# Patient Record
Sex: Female | Born: 1990 | Race: White | Hispanic: No | Smoking: Never smoker
Health system: Southern US, Community
[De-identification: ages and names within clinical notes are randomized; demographics above are authoritative.]

## PROBLEM LIST (undated history)

## (undated) ENCOUNTER — Inpatient Hospital Stay: Payer: Self-pay

## (undated) DIAGNOSIS — F329 Major depressive disorder, single episode, unspecified: Secondary | ICD-10-CM

## (undated) DIAGNOSIS — F32A Depression, unspecified: Secondary | ICD-10-CM

---

## 2015-09-06 ENCOUNTER — Encounter: Payer: Self-pay | Admitting: Emergency Medicine

## 2015-09-06 ENCOUNTER — Emergency Department
Admission: EM | Admit: 2015-09-06 | Discharge: 2015-09-07 | Disposition: A | Discharge status: Home / Self Care | Payer: 59 | Attending: Emergency Medicine | Admitting: Emergency Medicine

## 2015-09-06 DIAGNOSIS — F32A Depression, unspecified: Secondary | ICD-10-CM

## 2015-09-06 DIAGNOSIS — F332 Major depressive disorder, recurrent severe without psychotic features: Secondary | ICD-10-CM

## 2015-09-06 DIAGNOSIS — Z3202 Encounter for pregnancy test, result negative: Secondary | ICD-10-CM | POA: Insufficient documentation

## 2015-09-06 DIAGNOSIS — R45851 Suicidal ideations: Secondary | ICD-10-CM

## 2015-09-06 DIAGNOSIS — F329 Major depressive disorder, single episode, unspecified: Secondary | ICD-10-CM

## 2015-09-06 LAB — ETHANOL: Alcohol, Ethyl (B): <5 mg/dL (ref <5)

## 2015-09-06 LAB — URINALYSIS COMPLETE WITH MICROSCOPIC (ARMC ONLY)
BACTERIA UA: NONE SEEN
Bilirubin Urine: NEGATIVE
Glucose, UA: NEGATIVE mg/dL
Ketones, ur: NEGATIVE mg/dL
LEUKOCYTES UA: NEGATIVE
Nitrite: NEGATIVE
PH: 5 (ref 5.0–8.0)
Protein, ur: NEGATIVE mg/dL
Specific Gravity, Urine: 1.01 (ref 1.005–1.030)

## 2015-09-06 LAB — URINE DRUG SCREEN, QUALITATIVE (ARMC ONLY)
AMPHETAMINES, UR SCREEN: NOT DETECTED
Barbiturates, Ur Screen: NOT DETECTED
Benzodiazepine, Ur Scrn: NOT DETECTED
Cannabinoid 50 Ng, Ur ~~LOC~~: NOT DETECTED
Cocaine Metabolite,Ur ~~LOC~~: NOT DETECTED
MDMA (ECSTASY) UR SCREEN: NOT DETECTED
Methadone Scn, Ur: NOT DETECTED
OPIATE, UR SCREEN: NOT DETECTED
PHENCYCLIDINE (PCP) UR S: NOT DETECTED
Tricyclic, Ur Screen: NOT DETECTED

## 2015-09-06 LAB — COMPREHENSIVE METABOLIC PANEL
ALT: 13 U/L — ABNORMAL LOW (ref 14–54)
AST: 19 U/L (ref 15–41)
Albumin: 4 g/dL (ref 3.5–5.0)
Alkaline Phosphatase: 27 U/L — ABNORMAL LOW (ref 38–126)
Anion gap: 5 (ref 5–15)
BILIRUBIN TOTAL: 0.4 mg/dL (ref 0.3–1.2)
BUN: 14 mg/dL (ref 6–20)
CHLORIDE: 109 mmol/L (ref 101–111)
CO2: 27 mmol/L (ref 22–32)
Calcium: 9.1 mg/dL (ref 8.9–10.3)
Creatinine, Ser: 0.97 mg/dL (ref 0.44–1.00)
Glucose, Bld: 79 mg/dL (ref 65–99)
POTASSIUM: 3.8 mmol/L (ref 3.5–5.1)
Sodium: 141 mmol/L (ref 135–145)
TOTAL PROTEIN: 7.3 g/dL (ref 6.5–8.1)

## 2015-09-06 LAB — CBC
HEMATOCRIT: 43.4 % (ref 35.0–47.0)
Hemoglobin: 14.6 g/dL (ref 12.0–16.0)
MCH: 30.9 pg (ref 26.0–34.0)
MCHC: 33.6 g/dL (ref 32.0–36.0)
MCV: 92.1 fL (ref 80.0–100.0)
Platelets: 225 10*3/uL (ref 150–440)
RBC: 4.71 MIL/uL (ref 3.80–5.20)
RDW: 12.8 % (ref 11.5–14.5)
WBC: 8.2 10*3/uL (ref 3.6–11.0)

## 2015-09-06 LAB — PREGNANCY, URINE: Preg Test, Ur: NEGATIVE

## 2015-09-06 LAB — POCT PREGNANCY, URINE: PREG TEST UR: NEGATIVE

## 2015-09-06 LAB — SALICYLATE LEVEL: Salicylate Lvl: <4 mg/dL (ref 2.8–30.0)

## 2015-09-06 LAB — ACETAMINOPHEN LEVEL: Acetaminophen (Tylenol), Serum: <10 ug/mL — ABNORMAL LOW (ref 10–30)

## 2015-09-06 MED ORDER — DIAZEPAM 5 MG PO TABS
5.0000 mg | ORAL_TABLET | Freq: Once | ORAL | Status: DC
  Filled 2015-09-06: qty 1

## 2015-09-06 MED ORDER — SERTRALINE HCL 25 MG PO TABS
25.0000 mg | ORAL_TABLET | Freq: Every day | ORAL | Status: DC
  Administered 2015-09-07: 25 mg via ORAL
  Filled 2015-09-06: qty 1

## 2015-09-06 NOTE — ED Notes (Signed)
Pt states "i don't feel mentally well, i need to get help". Pt states has been feeling depressed for over one month. Pt denies SI.

## 2015-09-06 NOTE — ED Notes (Signed)
Pt given urine cup for sample.

## 2015-09-06 NOTE — ED Notes (Signed)
BEHAVIORAL HEALTH ROUNDING Patient sleeping: No. Patient alert and oriented: yes Behavior appropriate: Yes.  ; If no, describe:  Nutrition and fluids offered: Yes  Toileting and hygiene offered: Yes  Sitter present: yes Law enforcement present: Yes  

## 2015-09-06 NOTE — ED Provider Notes (Signed)
[facility] Emergency Department Provider Note     Time seen: ----------------------------------------- 10:28 PM on 09/06/2015 -----------------------------------------    I have reviewed the triage vital signs and the nursing notes.   HISTORY  Chief Complaint Psychiatric Evaluation    HPI Savannah Callahan is a 24 y.o. female who presents to the ER stating she doesn't feel mentally well and she needs to get help. She states she's been feeling depressed for over a month, denies any suicidal thoughts but is tearful and feels like she just can't go any further. She states for the last 6-8 months she's been feeling depressed but she is Worse, was previously treated by psychologist but has never been on any medication.   History reviewed. No pertinent past medical history.  There are no active problems to display for this patient.   History reviewed. No pertinent past surgical history.  Allergies Review of patient's allergies indicates no known allergies.  Social History Social History  Substance Use Topics  . Smoking status: Never Smoker   . Smokeless tobacco: Never Used  . Alcohol Use: No    Review of Systems Constitutional: Negative for fever. Eyes: Negative for visual changes. ENT: Negative for sore throat. Cardiovascular: Negative for chest pain. Respiratory: Negative for shortness of breath. Gastrointestinal: Negative for abdominal pain, vomiting and diarrhea. Genitourinary: Negative for dysuria. Musculoskeletal: Negative for back pain. Skin: Negative for rash. Neurological: Negative for headaches, focal weakness or numbness. Psychiatric: Positive for depression, negative for suicidal ideation 10-point ROS otherwise negative.  ____________________________________________   PHYSICAL EXAM:  VITAL SIGNS: ED Triage Vitals  Enc Vitals Group     BP 09/06/15 2159 130/96 mmHg     Pulse Rate 09/06/15 2159 74     Resp 09/06/15 2159 16      Temp 09/06/15 2159 98.5 F (36.9 C)     Temp Source 09/06/15 2159 Oral     SpO2 09/06/15 2159 100 %     Weight 09/06/15 2159 165 lb (74.844 kg)     Height 09/06/15 2159 5\' 6"  (1.676 m)     Head Cir --      Peak Flow --      Pain Score --      Pain Loc --      Pain Edu? --      Excl. in GC? --     Constitutional: Alert and oriented. No distress Eyes: Conjunctivae are normal. PERRL. Normal extraocular movements. Cardiovascular: Normal rate, regular rhythm. Normal and symmetric distal pulses are present in all extremities. No murmurs, rubs, or gallops. Respiratory: Normal respiratory effort without tachypnea nor retractions. Breath sounds are clear and equal bilaterally. No wheezes/rales/rhonchi. Gastrointestinal: Soft and nontender. No distention. No abdominal bruits.  Musculoskeletal: Nontender with normal range of motion in all extremities. No joint effusions.  No lower extremity tenderness nor edema. Neurologic:  Normal speech and language. No gross focal neurologic deficits are appreciated. Speech is normal. No gait instability. Skin:  Skin is warm, dry and intact. No rash noted. Psychiatric: Patient with depressed mood and affect, tears easily.  ____________________________________________  ED COURSE:  Pertinent labs & imaging results that were available during my care of the patient were reviewed by me and considered in my medical decision making (see chart for details). Patient is markedly depressed likely with major depressive episode. Would benefit from talking to psychiatry. I will start her on Zoloft here. ____________________________________________    LABS (pertinent positives/negatives)  Labs Reviewed  CBC  COMPREHENSIVE METABOLIC PANEL  ETHANOL  SALICYLATE LEVEL  ACETAMINOPHEN LEVEL  URINE DRUG SCREEN, QUALITATIVE ([facility] ONLY)  PREGNANCY, URINE  URINALYSIS COMPLETEWITH MICROSCOPIC ([facility] ONLY)  POC URINE PREG, ED    ____________________________________________  FINAL ASSESSMENT AND PLAN  Depression  Plan: Patient with labs as dictated above. Patient's been started on Zoloft, is medically stable to talk to psychiatry.   Emily Filbert, MD   Emily Filbert, MD 09/06/15 [phone]

## 2015-09-06 NOTE — ED Notes (Signed)

## 2015-09-06 NOTE — ED Notes (Signed)
BEHAVIORAL HEALTH ROUNDING Patient sleeping: Yes.   Patient alert and oriented: yes Behavior appropriate: Yes.  ; If no, describe:  Nutrition and fluids offered: sleeping Toileting and hygiene offered: sleeping Sitter present: no Law enforcement present: Yes  

## 2015-09-07 DIAGNOSIS — R45851 Suicidal ideations: Secondary | ICD-10-CM

## 2015-09-07 DIAGNOSIS — F332 Major depressive disorder, recurrent severe without psychotic features: Secondary | ICD-10-CM

## 2015-09-07 DIAGNOSIS — F329 Major depressive disorder, single episode, unspecified: Secondary | ICD-10-CM

## 2015-09-07 MED ORDER — SERTRALINE HCL 50 MG PO TABS: 50.0000 mg | ORAL_TABLET | Freq: Every day | ORAL | Status: DC

## 2015-09-07 MED ORDER — DIPHENHYDRAMINE HCL 25 MG PO CAPS
50.0000 mg | ORAL_CAPSULE | Freq: Once | ORAL | Status: AC
  Administered 2015-09-07: 50 mg via ORAL

## 2015-09-07 MED ORDER — DIPHENHYDRAMINE HCL 25 MG PO CAPS
ORAL_CAPSULE | ORAL | Status: AC
  Administered 2015-09-07: 50 mg via ORAL
  Filled 2015-09-07: qty 2

## 2015-09-07 NOTE — ED Notes (Signed)

## 2015-09-07 NOTE — BH Assessment (Signed)
Assessment Note  Savannah Callahan is an 24 y.o. female. Savannah Callahan reports to the ED under voluntary status.  She reports being depressed.  She states, "I'm having a rough time in my relationship, my marriage, and my life".  She states that she and her husband got into "a pretty big fight".  She stated "I don't feel mentally healthy".   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  She reports that she feels sad, has feelings of loss, a loss of control, feelings of hopelessness, easily upset, and can't feel happy.  She further stated that her husband told her he wanted a divorce and that he wanted nothing to do with her. She reports that she is having trouble handling her emotions.  She denied symptoms of anxiety.  She denied auditory or visual hallucinations.  She denied suicidal or homicidal ideation or intent.  Diagnosis:   Past Medical History: History reviewed. No pertinent past medical history.  History reviewed. No pertinent past surgical history.  Family History: History reviewed. No pertinent family history.  Social History:  reports that she has never smoked. She has never used smokeless tobacco. She reports that she does not drink alcohol or use illicit drugs.  Additional Social History:  Alcohol / Drug Use History of alcohol / drug use?: No history of alcohol / drug abuse  CIWA: CIWA-Ar BP: (!) 130/96 mmHg Pulse Rate: 74 COWS:    Allergies: No Known Allergies  Home Medications:  (Not in a hospital admission)  OB/GYN Status:  Patient's last menstrual period was 09/06/2015.  General Assessment Data [facility] Assessment: In system Is this a Tele or Face-to-Face Assessment?: Face-to-Face Is this an Initial Assessment or a Re-assessment for this encounter?: Initial Assessment Marital status: Married [place]: -did not want to disclose Is patient pregnant?: No Pregnancy Status: No Living Arrangements:  Spouse/significant other (3 step children) Can pt return to current living arrangement?: Yes Admission Status: Voluntary Is patient capable of signing voluntary admission?: Yes Referral Source: Self/Family/Friend Insurance type: Research officer, trade [employer] healthcare  Medical Screening Exam [facility] Walk-in ONLY) Medical Exam completed: Yes  Crisis Care Plan Living Arrangements: Spouse/significant other (3 step children) Name of Psychiatrist: None reported Name of Therapist: Clinical research associateCatheryn Wagner - [facility]  Education Status Is patient currently in school?: No Current Grade: n/a Highest grade of school patient has completed: Best boyDoctorate Name of [employer] Contact person: n/a  Risk to self with the past 6 months Suicidal Ideation: No Has patient been a risk to self within the past 6 months prior to admission? : No Suicidal Intent: No Has patient had any suicidal intent within the past 6 months prior to admission? : No Is patient at risk for suicide?: No Suicidal Plan?: No Has patient had any suicidal plan within the past 6 months prior to admission? : No Access to Means: No What has been your use of drugs/alcohol within the last 12 months?: Denied Previous Attempts/Gestures: No How many times?: 0 Other Self Harm Risks: None Triggers for Past Attempts: None known Intentional Self Injurious Behavior: None Family Suicide History: No Recent stressful life event(s): Conflict (Comment) (Fight with husband) Persecutory voices/beliefs?: No Depression: Yes Depression Symptoms: Feeling angry/irritable, Tearfulness, Feeling worthless/self pity Substance abuse history and/or treatment for substance abuse?: No Suicide prevention information given to non-admitted patients: Not applicable  Risk to Others within the past 6 months Homicidal Ideation: No Does patient have  any lifetime risk of violence toward others beyond the six months prior to admission? : No Thoughts of Harm to  Others: No Current Homicidal Intent: No Current Homicidal Plan: No Access to Homicidal Means: No Identified Victim: None reported History of harm to others?: No Assessment of Violence: None Noted Violent Behavior Description: None reported Does patient have access to weapons?: No Criminal Charges Pending?: No Does patient have a court date: No Is patient on probation?: No  Psychosis Hallucinations: None noted Delusions: None noted  Mental Status Report Appearance/Hygiene: In scrubs, Unremarkable Eye Contact: Poor Motor Activity: Unremarkable Speech: Soft Level of Consciousness: Alert Mood: Sad Affect: Flat Anxiety Level: None Thought Processes: Coherent Judgement: Unimpaired Orientation: Person, Place, Time, Situation Obsessive Compulsive Thoughts/Behaviors: None  Cognitive Functioning Concentration: Fair Memory: Recent Intact IQ: Average Insight: Fair Appetite: Good Sleep: No Change  ADLScreening [facility] Assessment Services) Patient's cognitive ability adequate to safely complete daily activities?: Yes Patient able to express need for assistance with ADLs?: Yes Independently performs ADLs?: Yes (appropriate for developmental age)  Prior Inpatient Therapy Prior Inpatient Therapy: No  Prior Outpatient Therapy Prior Outpatient Therapy: Yes Prior Therapy Dates: 2016 Prior Therapy Facilty/Provider(s): [facility] Reason for Treatment: Depression Does patient have an ACCT team?: No Does patient have Intensive In-House Services?  : No Does patient have [facility] services? : No Does patient have P4CC services?: No  ADL Screening (condition at time of admission) Patient's cognitive ability adequate to safely complete daily activities?: Yes Patient able to express need for assistance with ADLs?: Yes Independently performs ADLs?: Yes (appropriate for developmental age)       Abuse/Neglect Assessment (Assessment to be complete while patient is  alone) Physical Abuse: Denies Verbal Abuse: Denies Sexual Abuse: Denies Exploitation of patient/patient's resources: Denies Self-Neglect: Denies Values / Beliefs Cultural Requests During Hospitalization: None Spiritual Requests During Hospitalization: None   Advance Directives (For Healthcare) Does patient have an advance directive?: No Would patient like information on creating an advanced directive?: No - patient declined information    Additional Information 1:1 In Past 12 Months?: No CIRT Risk: No Elopement Risk: No Does patient have medical clearance?: Yes     Disposition:  Disposition Initial Assessment Completed for this Encounter: Yes Disposition of Patient: Referred to (To be seen by the psychiatrist)  On Site Evaluation by:   Reviewed with Physician:    Justice Deeds 09/07/2015 12:40 AM

## 2015-09-07 NOTE — ED Notes (Signed)
Patient took medication without incident. Patient resting quietly in room. No noted distress or abnormal behaviors noted. Will continue 15 minute checks and observation by security camera for safety.

## 2015-09-07 NOTE — ED Notes (Signed)
Patient transferred to ED BHU Rm 6 without difficulty.  Patient oriented to unit and informed of cameras in all rooms for patient safety.  Patient verbalized understanding.

## 2015-09-07 NOTE — ED Notes (Signed)

## 2015-09-07 NOTE — ED Notes (Signed)
ED BHU PLACEMENT JUSTIFICATION Is the patient under IVC or is there intent for IVC: No. Is the patient medically cleared: Yes.   Is there vacancy in the ED BHU: Yes.   Is the population mix appropriate for patient: Yes.   Is the patient awaiting placement in inpatient or outpatient setting: No. Has the patient had a psychiatric consult: No. Survey of unit performed for contraband, proper placement and condition of furniture, tampering with fixtures in bathroom, shower, and each patient room: Yes.  ; Findings:  APPEARANCE/BEHAVIOR calm NEURO ASSESSMENT Orientation: person Hallucinations: No.None noted (Hallucinations) Speech: Normal Gait: normal RESPIRATORY ASSESSMENT Normal expansion.  Clear to auscultation.  No rales, rhonchi, or wheezing. CARDIOVASCULAR ASSESSMENT regular rate and rhythm, S1, S2 normal, no murmur, click, rub or gallop GASTROINTESTINAL ASSESSMENT soft, nontender, BS WNL, no r/g EXTREMITIES normal strength, tone, and muscle mass PLAN OF CARE Provide calm/safe environment. Vital signs assessed twice daily. ED BHU Assessment once each 12-hour shift. Collaborate with intake RN daily or as condition indicates. Assure the ED provider has rounded once each shift. Provide and encourage hygiene. Provide redirection as needed. Assess for escalating behavior; address immediately and inform ED provider.  Assess family dynamic and appropriateness for visitation as needed: Yes.  ; If necessary, describe findings:  Educate the patient/family about BHU procedures/visitation: Yes.  ; If necessary, describe findings:  

## 2015-09-07 NOTE — ED Notes (Signed)
Patient assigned to appropriate care area. Patient oriented to unit/care area: Informed that, for their safety, care areas are designed for safety and monitored by security cameras at all times; and visiting hours explained to patient. Patient verbalizes understanding, and verbal contract for safety obtained. 
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
## 2015-09-07 NOTE — ED Notes (Signed)
Pt. Noted in room resting quietly;. No complaints or concerns voiced. No distress or abnormal behavior noted. Will continue to monitor with security cameras. Q 15 minute rounds continue. 

## 2015-09-07 NOTE — ED Notes (Signed)
Patient discharged ambulatory to home. She has her car in the parking lot. She denies SI or HI. Discharge instructions reviewed with patient, she verbalizes understanding. Patient received copy of DC plan, prescription, and all personal belongings.

## 2015-09-07 NOTE — ED Notes (Signed)
Patient to the nursing station, reports thinks she was bitten by something and her foot was itching.  Area of redness noted to right great toe and to 4th digit right foot.  MD notified, medication order received.

## 2015-09-07 NOTE — ED Notes (Signed)
BEHAVIORAL HEALTH ROUNDING Patient sleeping: Yes.   Patient alert and oriented: yes Behavior appropriate: Yes.  ; If no, describe:  Nutrition and fluids offered: No Toileting and hygiene offered: Yes  Sitter present: no Law enforcement present: Yes  

## 2015-09-07 NOTE — ED Notes (Signed)
Patient anticipating discharge.  Maintained on 15 minute checks and observation by security camera for safety.

## 2015-09-07 NOTE — ED Notes (Signed)
Patient spoke with husband on the telephone. Patient has been quiet, in no apparent distress. Waiting to see the psychiatrist. Maintained on 15 minute checks and observation by security camera for safety.

## 2015-09-07 NOTE — ED Notes (Signed)
Patient resting quietly in room. No noted distress or abnormal behaviors noted. Will continue 15 minute checks and observation by security camera for safety. 

## 2015-09-07 NOTE — ED Provider Notes (Signed)
-----------------------------------------   7:35 AM on 09/07/2015 -----------------------------------------   Blood pressure 130/96, pulse 74, temperature 98.5 F (36.9 C), temperature source Oral, resp. rate 16, height 5\' 6"  (1.676 m), weight 165 lb (74.844 kg), last menstrual period 09/06/2015, SpO2 100 %.  The patient had no acute events since last update.  Calm and cooperative at this time.  Disposition is pending per Psychiatry/Behavioral Medicine team recommendations.     Irean HongJade J Rajohn Henery, MD 09/07/15 [phone]

## 2015-09-07 NOTE — ED Notes (Signed)
BEHAVIORAL HEALTH ROUNDING Patient sleeping: No. Patient alert and oriented: yes Behavior appropriate: Yes.  ; If no, describe:  Nutrition and fluids offered: Yes  Toileting and hygiene offered: Yes  Sitter present: no Law enforcement present: Yes  

## 2015-09-07 NOTE — ED Notes (Signed)
BEHAVIORAL HEALTH ROUNDING Patient sleeping: Yes.   Patient alert and oriented: yes Behavior appropriate: Yes.  ; If no, describe:  Nutrition and fluids offered: sleeping Toileting and hygiene offered: No Sitter present: no Law enforcement present: Yes

## 2015-09-07 NOTE — Discharge Instructions (Signed)
Major Depressive Disorder Major depressive disorder is a mental illness. It also may be called clinical depression or unipolar depression. Major depressive disorder usually causes feelings of sadness, hopelessness, or helplessness. Some people with this disorder do not feel particularly sad but lose interest in doing things they used to enjoy (anhedonia). Major depressive disorder also can cause physical symptoms. It can interfere with work, school, relationships, and other normal everyday activities. The disorder varies in severity but is longer lasting and more serious than the sadness we all feel from time to time in our lives. Major depressive disorder often is triggered by stressful life events or major life changes. Examples of these triggers include divorce, loss of your job or home, a move, and the death of a family member or close friend. Sometimes this disorder occurs for no obvious reason at all. People who have family members with major depressive disorder or bipolar disorder are at higher risk for developing this disorder, with or without life stressors. Major depressive disorder can occur at any age. It may occur just once in your life (single episode major depressive disorder). It may occur multiple times (recurrent major depressive disorder). SYMPTOMS People with major depressive disorder have either anhedonia or depressed mood on nearly a daily basis for at least 2 weeks or longer. Symptoms of depressed mood include:  Feelings of sadness (blue or down in the dumps) or emptiness.  Feelings of hopelessness or helplessness.  Tearfulness or episodes of crying (may be observed by others).  Irritability (children and adolescents). In addition to depressed mood or anhedonia or both, people with this disorder have at least four of the following symptoms:  Difficulty sleeping or sleeping too much.   Significant change (increase or decrease) in appetite or weight.   Lack of energy or  motivation.  Feelings of guilt and worthlessness.   Difficulty concentrating, remembering, or making decisions.  Unusually slow movement (psychomotor retardation) or restlessness (as observed by others).   Recurrent wishes for death, recurrent thoughts of self-harm (suicide), or a suicide attempt. People with major depressive disorder commonly have persistent negative thoughts about themselves, other people, and the world. People with severe major depressive disorder may experiencedistorted beliefs or perceptions about the world (psychotic delusions). They also may see or hear things that are not real (psychotic hallucinations). DIAGNOSIS Major depressive disorder is diagnosed through an assessment by your health care provider. Your health care provider will ask aboutaspects of your daily life, such as mood,sleep, and appetite, to see if you have the diagnostic symptoms of major depressive disorder. Your health care provider may ask about your medical history and use of alcohol or drugs, including prescription medicines. Your health care provider also may do a physical exam and blood work. This is because certain medical conditions and the use of certain substances can cause major depressive disorder-like symptoms (secondary depression). Your health care provider also may refer you to a mental health specialist for further evaluation and treatment. TREATMENT It is important to recognize the symptoms of major depressive disorder and seek treatment. The following treatments can be prescribed for this disorder:   Medicine. Antidepressant medicines usually are prescribed. Antidepressant medicines are thought to correct chemical imbalances in the brain that are commonly associated with major depressive disorder. Other types of medicine may be added if the symptoms do not respond to antidepressant medicines alone or if psychotic delusions or hallucinations occur.  Talk therapy. Talk therapy can be  helpful in treating major depressive disorder by providing   support, education, and guidance. Certain types of talk therapy also can help with negative thinking (cognitive behavioral therapy) and with relationship issues that trigger this disorder (interpersonal therapy). A mental health specialist can help determine which treatment is best for you. Most people with major depressive disorder do well with a combination of medicine and talk therapy. Treatments involving electrical stimulation of the brain can be used in situations with extremely severe symptoms or when medicine and talk therapy do not work over time. These treatments include electroconvulsive therapy, transcranial magnetic stimulation, and vagal nerve stimulation.   This information is not intended to replace advice given to you by your health care provider. Make sure you discuss any questions you have with your health care provider.   Document Released: 03/04/2013 Document Revised: 11/28/2014 Document Reviewed: 03/04/2013 Elsevier Interactive Patient Education 2016 [employer]  

## 2015-10-02 ENCOUNTER — Ambulatory Visit: Payer: Self-pay | Admitting: Physician Assistant

## 2016-11-21 NOTE — L&D Delivery Note (Signed)
Cesarean Section Procedure Note Indications: cephalo-pelvic disproportion, failure to progress: arrest of descent and term intrauterine pregnancy  Pre-operative Diagnosis: Intrauterine pregnancy [redacted]w[redacted]d ;  cephalo-pelvic disproportion, failure to progress: arrest of descent and term intrauterine pregnancy Post-operative Diagnosis: same, delivered. Procedure: Low Transverse Cesarean Section Surgeon: Paul Steffany Schoenfelder, MD, FACOG Assistant(s): [address] Anesthesia: Epidural anesthesia Estimated Blood Loss:800 mL Complications: None; patient tolerated the procedure well. Disposition: PACU - hemodynamically stable. Condition: stable  Findings: A female infant in the cephalic presentation. Amniotic fluid - Meconium  Birth weight 9lbs 5oz.  Apgars of 8 and 9.  Intact placenta with a three-vessel cord. Grossly normal uterus, tubes and ovaries bilaterally. No intraabdominal adhesions were noted.  Procedure Details   The patient was taken to Operating Room, identified as the correct patient and the procedure verified as C-Section Delivery. A Time Out was held and the above information confirmed. After induction of anesthesia, the patient was draped and prepped in the usual sterile manner. A Pfannenstiel incision was made and carried down through the subcutaneous tissue to the fascia. Fascial incision was made and extended transversely with the Mayo scissors. The fascia was separated from the underlying rectus tissue superiorly and inferiorly. The peritoneum was identified and entered bluntly. Peritoneal incision was extended longitudinally. The utero-vesical peritoneal reflection was incised transversely and a bladder flap was created digitally.  A low transverse hysterotomy was made. The fetus was delivered atraumatically. The umbilical cord was clamped x2 and cut and the infant was handed to the awaiting pediatricians. The placenta was removed intact and appeared normal with a 3-vessel cord.  The uterus was  exteriorized and cleared of all clot and debris. The hysterotomy was closed with running sutures of 0 Vicryl suture. A second imbricating layer was placed with the same suture. Excellent hemostasis was observed. The uterus was returned to the abdomen. The pelvis was irrigated and again, excellent hemostasis was noted.  The On Q Pain pump System was then placed.  Trocars were placed through the abdominal wall into the subfascial space and these were used to thread the silver soaker cathaters into place.The rectus fascia was then reapproximated with running sutures of Maxon, with careful placement not to incorporate the cathaters. Subcutaneous tissues are then irrigated with saline and hemostasis assured.  Skin is then closed with 4-0 vicryl suture in a subcuticular fashion followed by skin adhesive. The cathaters are flushed each with 5 mL of Bupivicaine and stabilized into place with dressing. Instrument, sponge, and needle counts were correct prior to the abdominal closure and at the conclusion of the case.  The patient tolerated the procedure well and was transferred to the recovery room in stable condition.    

## 2016-12-21 ENCOUNTER — Inpatient Hospital Stay
Admission: EM | Admit: 2016-12-21 | Discharge: 2016-12-25 | DRG: 766 | Disposition: A | Discharge status: Home / Self Care | Payer: BLUE CROSS/BLUE SHIELD | Attending: Obstetrics & Gynecology | Admitting: Obstetrics & Gynecology

## 2016-12-21 ENCOUNTER — Observation Stay
Admission: EM | Admit: 2016-12-21 | Discharge: 2016-12-21 | Disposition: A | Discharge status: Home / Self Care | Payer: BLUE CROSS/BLUE SHIELD | Attending: Advanced Practice Midwife | Admitting: Advanced Practice Midwife

## 2016-12-21 DIAGNOSIS — Z6791 Unspecified blood type, Rh negative: Secondary | ICD-10-CM

## 2016-12-21 DIAGNOSIS — O480 Post-term pregnancy: Secondary | ICD-10-CM | POA: Diagnosis not present

## 2016-12-21 DIAGNOSIS — Z3A40 40 weeks gestation of pregnancy: Secondary | ICD-10-CM | POA: Insufficient documentation

## 2016-12-21 DIAGNOSIS — O339 Maternal care for disproportion, unspecified: Secondary | ICD-10-CM | POA: Diagnosis present

## 2016-12-21 DIAGNOSIS — O471 False labor at or after 37 completed weeks of gestation: Secondary | ICD-10-CM | POA: Diagnosis present

## 2016-12-21 DIAGNOSIS — F32A Depression, unspecified: Secondary | ICD-10-CM | POA: Diagnosis present

## 2016-12-21 DIAGNOSIS — O9081 Anemia of the puerperium: Secondary | ICD-10-CM | POA: Diagnosis not present

## 2016-12-21 DIAGNOSIS — F329 Major depressive disorder, single episode, unspecified: Secondary | ICD-10-CM | POA: Diagnosis present

## 2016-12-21 DIAGNOSIS — Z8249 Family history of ischemic heart disease and other diseases of the circulatory system: Secondary | ICD-10-CM

## 2016-12-21 DIAGNOSIS — Z823 Family history of stroke: Secondary | ICD-10-CM

## 2016-12-21 DIAGNOSIS — O324XX0 Maternal care for high head at term, not applicable or unspecified: Principal | ICD-10-CM | POA: Diagnosis present

## 2016-12-21 DIAGNOSIS — O26893 Other specified pregnancy related conditions, third trimester: Secondary | ICD-10-CM | POA: Diagnosis present

## 2016-12-21 HISTORY — DX: Major depressive disorder, single episode, unspecified: F32.9

## 2016-12-21 HISTORY — DX: Depression, unspecified: F32.A

## 2016-12-21 NOTE — OB Triage Note (Signed)
Patient came in c/o contractions that initially began around 1500 as mild cramping. As th day progress the abdominal pain worsen. After 2200 abdominal pain were consistently back to back. Patient stated not knowing if they were contractions since she has never had them before. Around 2100 is when is started noticing scant blood. Patient showed me her panty liner, there was a 1in in diameter size bleeding, nothing alarming. Reports positve fetal movement. Denies LOF. Patient on the monitor and oriented to room.

## 2016-12-21 NOTE — Progress Notes (Signed)
Savannah Callahan, CNM phoned with patient's triage information. Provider made aware of patient's complaint, cervical exam, and bright red scant amount of blood on glove after vaginal exam. Provider wants patient to be rechecked in 1.5 hrs. Provider agrees with intermit. Monitoring. Provider placed order for observation. Will call [name] with an update after next vaginal exam.

## 2016-12-21 NOTE — Discharge Summary (Signed)
Physician Final Progress Note  Patient ID: Savannah Callahan MRN: [id number] DOB/AGE: 18-Dec-1990 26 y.o.  Admit date: 12/21/2016 Admitting provider: Tresea Mall, CNM Discharge date: 12/21/2016   Admission Diagnoses: G1P0 at [redacted]w[redacted]d with c/o abdominal pain/contractions q 2-3 minutes since 10pm last night and spotting when wiping. Pt admits positive fetal movement and denies LOF. She admits that she has not had adequate hydration all day yesterday.  Discharge Diagnoses:  Active Problems:   Labor and delivery, indication for care IUP at [redacted]w[redacted]d with reactive NST, not in labor  History of Present Illness: The patient was admitted for observation and placed on monitors, cervical exam  No past medical history on file.  No past surgical history on file.  No current facility-administered medications on file prior to encounter.    Current Outpatient Prescriptions on File Prior to Encounter  Medication Sig Dispense Refill  . sertraline (ZOLOFT) 50 MG tablet Take 1 tablet (50 mg total) by mouth daily. 30 tablet 0    No Known Allergies  Social History   Social History  . Marital status: Single    Spouse name: N/A  . Number of children: N/A  . Years of education: N/A   Occupational History  . Not on file.   Social History Main Topics  . Smoking status: Never Smoker  . Smokeless tobacco: Never Used  . Alcohol use No  . Drug use: No  . Sexual activity: Yes    Birth control/ protection: Pill     Comment: sex last on 12/20/16   Other Topics Concern  . Not on file   Social History Narrative  . No narrative on file    Physical Exam: BP 128/79   Pulse 86   Temp 97.6 F (36.4 C) (Oral)   Gen: NAD CV: RRR Pulm: CTAB Pelvic: 1.5/60/-2 on admission and no change prior to discharge Fetal Well Being: 125 bpm, moderate variability, +accelerations, -decelerations Category I Toco: q 2-3 on admission palpate mild, after observation time and hydration activity is irritability Ext: no  evidence of DVT  Consults: None  Significant Findings/ Diagnostic Studies: none  Procedures: NST  Discharge Condition: good  Disposition: 01-Home or Self Care  Diet: Regular diet  Discharge Activity: Activity as tolerated  Discharge Instructions    Discharge activity:  No Restrictions    Complete by:  As directed    Discharge diet:  No restrictions    Complete by:  As directed    Fetal Kick Count:  Lie on our left side for one hour after a meal, and count the number of times your baby kicks.  If it is less than 5 times, get up, move around and drink some juice.  Repeat the test 30 minutes later.  If it is still less than 5 kicks in an hour, notify your doctor.    Complete by:  As directed    LABOR:  When conractions begin, you should start to time them from the beginning of one contraction to the beginning  of the next.  When contractions are 5 - 10 minutes apart or less and have been regular for at least an hour, you should call your health care provider.    Complete by:  As directed    No sexual activity restrictions    Complete by:  As directed    Notify physician for bleeding from the vagina    Complete by:  As directed    Notify physician for blurring of vision or spots before the  eyes    Complete by:  As directed    Notify physician for chills or fever    Complete by:  As directed    Notify physician for fainting spells, "black outs" or loss of consciousness    Complete by:  As directed    Notify physician for increase in vaginal discharge    Complete by:  As directed    Notify physician for leaking of fluid    Complete by:  As directed    Notify physician for pain or burning when urinating    Complete by:  As directed    Notify physician for pelvic pressure (sudden increase)    Complete by:  As directed    Notify physician for severe or continued nausea or vomiting    Complete by:  As directed    Notify physician for sudden gushing of fluid from the vagina (with or  without continued leaking)    Complete by:  As directed    Notify physician for sudden, constant, or occasional abdominal pain    Complete by:  As directed    Notify physician if baby moving less than usual    Complete by:  As directed      Allergies as of 12/21/2016   No Known Allergies     Medication List    TAKE these medications   prenatal multivitamin Tabs tablet Take 1 tablet by mouth daily at 12 noon.   sertraline 50 MG tablet Commonly known as:  ZOLOFT Take 1 tablet (50 mg total) by mouth daily.      Follow up: Induction of labor scheduled for 12/25/2016 when pt is 41 weeks  Total time spent taking care of this patient: 15 minutes  Signed: Tresea MallGLEDHILL,Davyn Morandi, CNM  12/21/2016, 3:42 AM

## 2016-12-21 NOTE — Progress Notes (Signed)
Patient discharge to home. Discharge instructions reviewed. At home instructions given, explained when to return and what to expect during this stage of labor. Patient verbalized understanding. Patient discharge in stable condition, ambulatory, with significant other.

## 2016-12-22 ENCOUNTER — Encounter
Admission: EM | Disposition: A | Discharge status: Home / Self Care | Payer: Self-pay | Source: Home / Self Care | Attending: Obstetrics & Gynecology

## 2016-12-22 ENCOUNTER — Inpatient Hospital Stay: Payer: BLUE CROSS/BLUE SHIELD | Admitting: Anesthesiology

## 2016-12-22 DIAGNOSIS — O9081 Anemia of the puerperium: Secondary | ICD-10-CM | POA: Diagnosis not present

## 2016-12-22 DIAGNOSIS — Z8249 Family history of ischemic heart disease and other diseases of the circulatory system: Secondary | ICD-10-CM | POA: Diagnosis not present

## 2016-12-22 DIAGNOSIS — F32A Depression, unspecified: Secondary | ICD-10-CM | POA: Diagnosis present

## 2016-12-22 DIAGNOSIS — Z823 Family history of stroke: Secondary | ICD-10-CM | POA: Diagnosis not present

## 2016-12-22 DIAGNOSIS — O26893 Other specified pregnancy related conditions, third trimester: Secondary | ICD-10-CM | POA: Diagnosis present

## 2016-12-22 DIAGNOSIS — Z6791 Unspecified blood type, Rh negative: Secondary | ICD-10-CM | POA: Diagnosis not present

## 2016-12-22 DIAGNOSIS — O324XX0 Maternal care for high head at term, not applicable or unspecified: Secondary | ICD-10-CM | POA: Diagnosis present

## 2016-12-22 DIAGNOSIS — Z3A40 40 weeks gestation of pregnancy: Secondary | ICD-10-CM | POA: Diagnosis not present

## 2016-12-22 DIAGNOSIS — O339 Maternal care for disproportion, unspecified: Secondary | ICD-10-CM | POA: Diagnosis present

## 2016-12-22 DIAGNOSIS — Z3493 Encounter for supervision of normal pregnancy, unspecified, third trimester: Secondary | ICD-10-CM | POA: Diagnosis present

## 2016-12-22 DIAGNOSIS — F329 Major depressive disorder, single episode, unspecified: Secondary | ICD-10-CM | POA: Diagnosis present

## 2016-12-22 LAB — CBC
HEMATOCRIT: 37.6 % (ref 35.0–47.0)
HEMOGLOBIN: 13.3 g/dL (ref 12.0–16.0)
MCH: 32.2 pg (ref 26.0–34.0)
MCHC: 35.4 g/dL (ref 32.0–36.0)
MCV: 91 fL (ref 80.0–100.0)
Platelets: 214 10*3/uL (ref 150–440)
RBC: 4.13 MIL/uL (ref 3.80–5.20)
RDW: 14.4 % (ref 11.5–14.5)
WBC: 14.7 10*3/uL — ABNORMAL HIGH (ref 3.6–11.0)

## 2016-12-22 LAB — ABO/RH: ABO/RH(D): A NEG

## 2016-12-22 SURGERY — Surgical Case
Anesthesia: Epidural | Site: Abdomen | Wound class: Clean Contaminated

## 2016-12-22 MED ORDER — FENTANYL 2.5 MCG/ML W/ROPIVACAINE 0.2% IN NS 100 ML EPIDURAL INFUSION (ARMC-ANES): 10.0000 mL/h | EPIDURAL | Status: DC

## 2016-12-22 MED ORDER — LIDOCAINE HCL (PF) 1 % IJ SOLN: 30.0000 mL | INTRAMUSCULAR | Status: DC | PRN

## 2016-12-22 MED ORDER — DIPHENHYDRAMINE HCL 50 MG/ML IJ SOLN
12.5000 mg | INTRAMUSCULAR | Status: DC | PRN
  Filled 2016-12-22: qty 1

## 2016-12-22 MED ORDER — OXYTOCIN 40 UNITS IN LACTATED RINGERS INFUSION - SIMPLE MED
2.5000 [IU]/h | INTRAVENOUS | Status: DC
  Filled 2016-12-22: qty 1000

## 2016-12-22 MED ORDER — CEFOXITIN SODIUM-DEXTROSE 2-2.2 GM-% IV SOLR (PREMIX)
INTRAVENOUS | Status: AC
  Filled 2016-12-22: qty 50

## 2016-12-22 MED ORDER — EPHEDRINE 5 MG/ML INJ: 10.0000 mg | INTRAVENOUS | Status: DC | PRN

## 2016-12-22 MED ORDER — NALOXONE HCL 2 MG/2ML IJ SOSY
1.0000 ug/kg/h | PREFILLED_SYRINGE | INTRAVENOUS | Status: DC | PRN
  Filled 2016-12-22: qty 2

## 2016-12-22 MED ORDER — NALOXONE HCL 0.4 MG/ML IJ SOLN: 0.4000 mg | INTRAMUSCULAR | Status: DC | PRN

## 2016-12-22 MED ORDER — LACTATED RINGERS IV SOLN
INTRAVENOUS | Status: DC
  Administered 2016-12-22: 125 mL via INTRAVENOUS

## 2016-12-22 MED ORDER — DIPHENHYDRAMINE HCL 25 MG PO CAPS: 25.0000 mg | ORAL_CAPSULE | ORAL | Status: DC | PRN

## 2016-12-22 MED ORDER — FENTANYL CITRATE (PF) 100 MCG/2ML IJ SOLN
INTRAMUSCULAR | Status: AC
  Filled 2016-12-22: qty 2

## 2016-12-22 MED ORDER — MORPHINE SULFATE (PF) 0.5 MG/ML IJ SOLN
INTRAMUSCULAR | Status: AC
  Filled 2016-12-22: qty 10

## 2016-12-22 MED ORDER — LIDOCAINE HCL (PF) 1 % IJ SOLN
INTRAMUSCULAR | Status: DC | PRN
  Administered 2016-12-22: 3 mL

## 2016-12-22 MED ORDER — BUPIVACAINE HCL (PF) 0.25 % IJ SOLN
10.0000 mL | Freq: Once | INTRAMUSCULAR | Status: DC
  Filled 2016-12-22: qty 10

## 2016-12-22 MED ORDER — SOD CITRATE-CITRIC ACID 500-334 MG/5ML PO SOLN
ORAL | Status: AC
  Administered 2016-12-22: 30 mL via ORAL
  Filled 2016-12-22: qty 15

## 2016-12-22 MED ORDER — SOD CITRATE-CITRIC ACID 500-334 MG/5ML PO SOLN: 30.0000 mL | ORAL | Status: DC

## 2016-12-22 MED ORDER — KETOROLAC TROMETHAMINE 30 MG/ML IJ SOLN: 30.0000 mg | Freq: Four times a day (QID) | INTRAMUSCULAR | Status: DC | PRN

## 2016-12-22 MED ORDER — ACETAMINOPHEN 500 MG PO TABS
1000.0000 mg | ORAL_TABLET | Freq: Four times a day (QID) | ORAL | Status: DC
  Administered 2016-12-23 (×3): 1000 mg via ORAL
  Filled 2016-12-22 (×3): qty 2

## 2016-12-22 MED ORDER — MIDAZOLAM HCL 2 MG/2ML IJ SOLN
INTRAMUSCULAR | Status: AC
  Filled 2016-12-22: qty 2

## 2016-12-22 MED ORDER — NALBUPHINE HCL 10 MG/ML IJ SOLN: 5.0000 mg | INTRAMUSCULAR | Status: DC | PRN

## 2016-12-22 MED ORDER — FENTANYL CITRATE (PF) 100 MCG/2ML IJ SOLN
INTRAMUSCULAR | Status: DC | PRN
  Administered 2016-12-22: 100 ug via INTRAVENOUS
  Administered 2016-12-22 (×2): 50 ug via INTRAVENOUS

## 2016-12-22 MED ORDER — PHENYLEPHRINE 40 MCG/ML (10ML) SYRINGE FOR IV PUSH (FOR BLOOD PRESSURE SUPPORT)
PREFILLED_SYRINGE | INTRAVENOUS | Status: AC
  Filled 2016-12-22: qty 10

## 2016-12-22 MED ORDER — KETOROLAC TROMETHAMINE 30 MG/ML IJ SOLN
30.0000 mg | Freq: Four times a day (QID) | INTRAMUSCULAR | Status: DC | PRN
Start: 2016-12-22 — End: 2016-12-23

## 2016-12-22 MED ORDER — OXYCODONE HCL 5 MG/5ML PO SOLN: 5.0000 mg | Freq: Once | ORAL | Status: DC | PRN

## 2016-12-22 MED ORDER — TERBUTALINE SULFATE 1 MG/ML IJ SOLN: 0.2500 mg | Freq: Once | INTRAMUSCULAR | Status: DC | PRN

## 2016-12-22 MED ORDER — BUPIVACAINE 0.25 % ON-Q PUMP DUAL CATH 400 ML
400.0000 mL | INJECTION | Status: DC
  Filled 2016-12-22 (×2): qty 400

## 2016-12-22 MED ORDER — SODIUM CHLORIDE 0.9% FLUSH: 3.0000 mL | INTRAVENOUS | Status: DC | PRN

## 2016-12-22 MED ORDER — ONDANSETRON HCL 4 MG/2ML IJ SOLN: 4.0000 mg | Freq: Three times a day (TID) | INTRAMUSCULAR | Status: DC | PRN

## 2016-12-22 MED ORDER — FENTANYL 2.5 MCG/ML W/ROPIVACAINE 0.2% IN NS 100 ML EPIDURAL INFUSION (ARMC-ANES)
EPIDURAL | Status: DC | PRN
  Administered 2016-12-22: 10 mL/h via EPIDURAL

## 2016-12-22 MED ORDER — PHENYLEPHRINE HCL 10 MG/ML IJ SOLN
INTRAMUSCULAR | Status: DC | PRN
  Administered 2016-12-22: 80 ug via INTRAVENOUS

## 2016-12-22 MED ORDER — PHENYLEPHRINE 40 MCG/ML (10ML) SYRINGE FOR IV PUSH (FOR BLOOD PRESSURE SUPPORT): 80.0000 ug | PREFILLED_SYRINGE | INTRAVENOUS | Status: DC | PRN

## 2016-12-22 MED ORDER — LACTATED RINGERS IV SOLN: 500.0000 mL | Freq: Once | INTRAVENOUS | Status: DC

## 2016-12-22 MED ORDER — OXYTOCIN BOLUS FROM INFUSION: 500.0000 mL | Freq: Once | INTRAVENOUS | Status: DC

## 2016-12-22 MED ORDER — LACTATED RINGERS IV SOLN: 500.0000 mL | INTRAVENOUS | Status: DC | PRN

## 2016-12-22 MED ORDER — BUPIVACAINE HCL (PF) 0.25 % IJ SOLN
INTRAMUSCULAR | Status: DC | PRN
  Administered 2016-12-22 (×2): 5 mL via EPIDURAL

## 2016-12-22 MED ORDER — OXYTOCIN 40 UNITS IN LACTATED RINGERS INFUSION - SIMPLE MED
1.0000 m[IU]/min | INTRAVENOUS | Status: DC
  Administered 2016-12-22: 1 m[IU]/min via INTRAVENOUS

## 2016-12-22 MED ORDER — BUPIVACAINE HCL (PF) 0.5 % IJ SOLN
INTRAMUSCULAR | Status: AC
  Filled 2016-12-22: qty 30

## 2016-12-22 MED ORDER — SOD CITRATE-CITRIC ACID 500-334 MG/5ML PO SOLN: 30.0000 mL | ORAL | Status: DC | PRN

## 2016-12-22 MED ORDER — OXYCODONE HCL 5 MG PO TABS
5.0000 mg | ORAL_TABLET | ORAL | Status: DC | PRN
  Administered 2016-12-23 (×2): 5 mg via ORAL
  Filled 2016-12-22 (×2): qty 1

## 2016-12-22 MED ORDER — LACTATED RINGERS IV SOLN
INTRAVENOUS | Status: DC
  Administered 2016-12-22: 20:00:00 via INTRAVENOUS

## 2016-12-22 MED ORDER — CEFOXITIN SODIUM-DEXTROSE 2-2.2 GM-% IV SOLR (PREMIX)
2.0000 g | INTRAVENOUS | Status: AC
  Administered 2016-12-22: 2 g via INTRAVENOUS

## 2016-12-22 MED ORDER — OXYCODONE HCL 5 MG PO TABS: 5.0000 mg | ORAL_TABLET | Freq: Once | ORAL | Status: DC | PRN

## 2016-12-22 MED ORDER — BUPIVACAINE HCL (PF) 0.5 % IJ SOLN
INTRAMUSCULAR | Status: DC | PRN
  Administered 2016-12-22: 10 mL

## 2016-12-22 MED ORDER — OXYTOCIN 10 UNIT/ML IJ SOLN: 10.0000 [IU] | Freq: Once | INTRAMUSCULAR | Status: DC

## 2016-12-22 MED ORDER — NALBUPHINE HCL 10 MG/ML IJ SOLN: 5.0000 mg | Freq: Once | INTRAMUSCULAR | Status: DC | PRN

## 2016-12-22 MED ORDER — LIDOCAINE 2% (20 MG/ML) 5 ML SYRINGE
INTRAMUSCULAR | Status: DC | PRN
  Administered 2016-12-22 (×5): 100 mg via INTRAVENOUS

## 2016-12-22 MED ORDER — FENTANYL CITRATE (PF) 250 MCG/5ML IJ SOLN
10.0000 mL/h | INTRAMUSCULAR | Status: DC
  Administered 2016-12-22 (×2): 10 mL/h via EPIDURAL
  Filled 2016-12-22 (×3): qty 5

## 2016-12-22 MED ORDER — MIDAZOLAM HCL 2 MG/2ML IJ SOLN
INTRAMUSCULAR | Status: DC | PRN
Start: 2016-12-22 — End: 2016-12-22
  Administered 2016-12-22: 2 mg via INTRAVENOUS

## 2016-12-22 MED ORDER — DIPHENHYDRAMINE HCL 50 MG/ML IJ SOLN: 12.5000 mg | INTRAMUSCULAR | Status: DC | PRN

## 2016-12-22 MED ORDER — ONDANSETRON HCL 4 MG/2ML IJ SOLN: 4.0000 mg | Freq: Four times a day (QID) | INTRAMUSCULAR | Status: DC | PRN

## 2016-12-22 MED ORDER — LIDOCAINE-EPINEPHRINE (PF) 1.5 %-1:200000 IJ SOLN
INTRAMUSCULAR | Status: DC | PRN
  Administered 2016-12-22: 3 mL via PERINEURAL

## 2016-12-22 MED ORDER — MORPHINE SULFATE (PF) 0.5 MG/ML IJ SOLN
INTRAMUSCULAR | Status: DC | PRN
  Administered 2016-12-22: 1.5 mg via EPIDURAL
  Administered 2016-12-22: 1 mg via EPIDURAL
  Administered 2016-12-22: 1.5 mg via EPIDURAL

## 2016-12-22 MED ORDER — FENTANYL CITRATE (PF) 100 MCG/2ML IJ SOLN: 25.0000 ug | INTRAMUSCULAR | Status: DC | PRN

## 2016-12-22 SURGICAL SUPPLY — 26 items
BARRIER ADHS 3X4 INTERCEED (GAUZE/BANDAGES/DRESSINGS) ×2 IMPLANT
CANISTER SUCT 3000ML (MISCELLANEOUS) ×2 IMPLANT
CATH KIT ON-Q SILVERSOAK 5 (CATHETERS) ×2 IMPLANT
CATH KIT ON-Q SILVERSOAK 5IN (CATHETERS) ×4 IMPLANT
CHLORAPREP W/TINT 26ML (MISCELLANEOUS) ×4 IMPLANT
DRSG OPSITE POSTOP 4X10 (GAUZE/BANDAGES/DRESSINGS) ×2 IMPLANT
ELECT CAUTERY BLADE 6.4 (BLADE) IMPLANT
ELECT REM PT RETURN 9FT ADLT (ELECTROSURGICAL) ×2
ELECTRODE REM PT RTRN 9FT ADLT (ELECTROSURGICAL) ×1 IMPLANT
GLOVE INDICATOR 7.5 STRL GRN (GLOVE) ×4 IMPLANT
GLOVE SKINSENSE NS SZ8.0 LF (GLOVE) ×3
GLOVE SKINSENSE STRL SZ8.0 LF (GLOVE) ×3 IMPLANT
GOWN STRL REUS W/ TWL LRG LVL3 (GOWN DISPOSABLE) ×1 IMPLANT
GOWN STRL REUS W/ TWL XL LVL3 (GOWN DISPOSABLE) ×2 IMPLANT
GOWN STRL REUS W/TWL LRG LVL3 (GOWN DISPOSABLE) ×1
GOWN STRL REUS W/TWL XL LVL3 (GOWN DISPOSABLE) ×2
LIQUID BAND (GAUZE/BANDAGES/DRESSINGS) ×2 IMPLANT
NS IRRIG 1000ML POUR BTL (IV SOLUTION) ×2 IMPLANT
PACK C SECTION AR (MISCELLANEOUS) ×2 IMPLANT
PAD OB MATERNITY 4.3X12.25 (PERSONAL CARE ITEMS) ×4 IMPLANT
PAD PREP 24X41 OB/GYN DISP (PERSONAL CARE ITEMS) ×2 IMPLANT
SPONGE LAP 18X18 5 PK (GAUZE/BANDAGES/DRESSINGS) ×4 IMPLANT
SUT MAXON ABS #0 GS21 30IN (SUTURE) ×6 IMPLANT
SUT VIC AB 1 CT1 36 (SUTURE) ×6 IMPLANT
SUT VIC AB 2-0 CT1 36 (SUTURE) IMPLANT
SUT VIC AB 4-0 FS2 27 (SUTURE) ×2 IMPLANT

## 2016-12-22 NOTE — Plan of Care (Signed)
Called dr piscitello. Received order to increase epidural rate to 20cc/hr x 1 hour. Then turn down to 10 cc again.

## 2016-12-22 NOTE — OB Triage Note (Signed)
Patient arrived in triage with c/o contraction since approx 2100 4-5 mins apart. States good fetal movement. Denies leaking of fluid or vaginal bleeding. Patient standing at bedside to cope with ctx's. Plan to place on EFM and assess for labor. Pt verbalized understanding.

## 2016-12-22 NOTE — Progress Notes (Signed)
  FHR down to 100's x 3 minutes. Pt repositioned to left side. Rechecked cervix. 9.5 cm./+1

## 2016-12-22 NOTE — Discharge Summary (Signed)
Obstetrical Discharge Summary  Date of Admission: 12/21/2016 Date of Discharge: 12/25/2016  Discharge Diagnosis: Term Pregnancy-delivered Primary OB:  [facility]   Gestational Age at Delivery: 7625w4d  Antepartum complications: none Date of Delivery: 12/25/2016  Delivered By: Annamarie MajorPaul Harris, MD Delivery Type: primary cesarean section, low transverse incision Intrapartum complications/course: Failure to Progress and Arrest of Descent Anesthesia: epidural Placenta: manual removal Laceration: n/a Episiotomy: none Live born female  Birth Weight:  9-5 lbs APGAR: 8, 9   Post partum course: Since the delivery, patient has tolerate activity, diet, and daily functions without difficulty or complication.  Min lochia.  No breast concerns at this time.  No signs of depression currently.   Postpartum Exam BP 123/73 (BP Location: Left Arm)   Pulse 90   Temp 97.7 F (36.5 C) (Oral)   Resp 14   Ht 5\' 5"  (1.651 m)   Wt 243 lb (110.2 kg)   SpO2 100%   BMI 40.44 kg/m  General appearance: alert, cooperative and no distress GI: soft, non-tender; bowel sounds normal; no masses,  no organomegaly and Fundus firm Extremities: extremities normal, atraumatic, no cyanosis or edema, no edema, redness or tenderness in the calves or thighs and no ulcers, gangrene or trophic changes Incision: clean, dry, intact   Disposition: home with infant Rh Immune globulin given: not applicable Rubella vaccine given: no Varicella vaccine given: no Tdap vaccine given in AP or PP setting: declined Flu vaccine given in AP or PP setting: declined Contraception: oral progesterone-only contraceptive  Prenatal Labs: A NEG//Rubella Immune//RPR negative//HIV negative/HepB Surface Ag negative//plans to breastfeed  Plan:  Savannah Callahan was discharged to home in good condition. [facility]  Discharge Medications: Allergies as of 12/25/2016   No Known Allergies     Medication List     TAKE these medications   oxyCODONE-acetaminophen 5-325 MG tablet Commonly known as:  PERCOCET/ROXICET Take 2 tablets by mouth every 4 (four) hours as needed (pain scale > 7).   prenatal multivitamin Tabs tablet Take 1 tablet by mouth daily at 12 noon.       Follow-up arrangements:  Follow-up Information    Letitia Libraobert Paul Harris, MD. Schedule an appointment as soon as possible for a visit in 1 week.   Specialty:  Obstetrics and Gynecology Why:  For Post Op Contact information: [address] [place] [address] [phone]          Savannah MohairStephen Jovonne Wilton, MD 12/25/2016 9:52 AM

## 2016-12-22 NOTE — Progress Notes (Signed)
Cervical change made to 4.5 cm. Admission for delivery.  Otherwise, see H&P for complete admission details.   Thomasene MohairStephen Camdon Saetern, MD 12/22/2016 9:22 AM

## 2016-12-22 NOTE — Op Note (Signed)
Cesarean Section Procedure Note Indications: cephalo-pelvic disproportion, failure to progress: arrest of descent and term intrauterine pregnancy  Pre-operative Diagnosis: Intrauterine pregnancy 754w4d ;  cephalo-pelvic disproportion, failure to progress: arrest of descent and term intrauterine pregnancy Post-operative Diagnosis: same, delivered. Procedure: Low Transverse Cesarean Section Surgeon: Annamarie MajorPaul Harris, MD, FACOG Assistant(s): [address] Anesthesia: Epidural anesthesia Estimated Blood Loss:800 mL Complications: None; patient tolerated the procedure well. Disposition: PACU - hemodynamically stable. Condition: stable  Findings: A female infant in the cephalic presentation. Amniotic fluid - Meconium  Birth weight 9lbs 5oz.  Apgars of 8 and 9.  Intact placenta with a three-vessel cord. Grossly normal uterus, tubes and ovaries bilaterally. No intraabdominal adhesions were noted.  Procedure Details   The patient was taken to Operating Room, identified as the correct patient and the procedure verified as C-Section Delivery. A Time Out was held and the above information confirmed. After induction of anesthesia, the patient was draped and prepped in the usual sterile manner. A Pfannenstiel incision was made and carried down through the subcutaneous tissue to the fascia. Fascial incision was made and extended transversely with the Mayo scissors. The fascia was separated from the underlying rectus tissue superiorly and inferiorly. The peritoneum was identified and entered bluntly. Peritoneal incision was extended longitudinally. The utero-vesical peritoneal reflection was incised transversely and a bladder flap was created digitally.  A low transverse hysterotomy was made. The fetus was delivered atraumatically. The umbilical cord was clamped x2 and cut and the infant was handed to the awaiting pediatricians. The placenta was removed intact and appeared normal with a 3-vessel cord.  The uterus was  exteriorized and cleared of all clot and debris. The hysterotomy was closed with running sutures of 0 Vicryl suture. A second imbricating layer was placed with the same suture. Excellent hemostasis was observed. The uterus was returned to the abdomen. The pelvis was irrigated and again, excellent hemostasis was noted.  The On Q Pain pump System was then placed.  Trocars were placed through the abdominal wall into the subfascial space and these were used to thread the silver soaker cathaters into place.The rectus fascia was then reapproximated with running sutures of Maxon, with careful placement not to incorporate the cathaters. Subcutaneous tissues are then irrigated with saline and hemostasis assured.  Skin is then closed with 4-0 vicryl suture in a subcuticular fashion followed by skin adhesive. The cathaters are flushed each with 5 mL of Bupivicaine and stabilized into place with dressing. Instrument, sponge, and needle counts were correct prior to the abdominal closure and at the conclusion of the case.  The patient tolerated the procedure well and was transferred to the recovery room in stable condition.

## 2016-12-22 NOTE — Anesthesia Procedure Notes (Signed)
Epidural Patient location during procedure: OB Start time: 12/22/2016 10:15 AM End time: 12/22/2016 10:33 AM  Staffing Resident/CRNA: Junious SilkNOLES, Nilza Eaker Performed: resident/CRNA   Preanesthetic Checklist Completed: patient identified, site marked, surgical consent, pre-op evaluation, timeout performed, IV checked, risks and benefits discussed and monitors and equipment checked  Epidural Patient position: sitting Prep: Betadine Patient monitoring: heart rate, continuous pulse ox and blood pressure Approach: midline Location: L3-L4 Injection technique: LOR saline  Needle:  Needle type: Tuohy  Needle gauge: 17 G Needle length: 9 cm and 9 Catheter type: closed end flexible Catheter size: 20 Guage Test dose: negative and 1.5% lidocaine with Epi 1:200 K  Assessment Events: blood not aspirated, injection not painful, no injection resistance, negative IV test and no paresthesia  Additional Notes   Patient tolerated the insertion well without complications.Reason for block:procedure for pain

## 2016-12-22 NOTE — Progress Notes (Signed)
  Labor Progress Note   26 y.o. G1P0 @ 7657w4d , admitted for  Pregnancy, Labor Management.   Subjective:  Protracted labor while pushing, arrest of descent by my exam even with pushing  Objective:  BP 118/83   Pulse (!) 102   Temp 98.2 F (36.8 C) (Axillary)   Resp (!) 22   Ht 5\' 5"  (1.651 m)   Wt 243 lb (110.2 kg)   SpO2 100%   BMI 40.44 kg/m  Abd: moderate Extr: trace to 1+ bilateral pedal edema SVE: CERVIX: ant lip that is reducuble yet station remains high w min descent w pushing FHTs 140s with accels and she has decels with ctxs/pushing, improved w positioning and O2  Assessment & Plan:  G1P0 @ 2757w4d, admitted for  Pregnancy and Labor/Delivery Management PROTRACTED LABOR, ARREST of DESCENT  1. Pain management: epidural. 2. FWB: FHT category 2.  3. ID: GBS negative 4. Labor management: CS discussed The risks of cesarean section discussed with the patient included but were not limited to: bleeding which may require transfusion or reoperation; infection which may require antibiotics; injury to bowel, bladder, ureters or other surrounding organs; injury to the fetus; need for additional procedures including hysterectomy in the event of a life-threatening hemorrhage; placental abnormalities wth subsequent pregnancies, incisional problems, thromboembolic phenomenon and other postoperative/anesthesia complications. The patient concurred with the proposed plan, giving informed written consent for the procedure.   All discussed with patient, see orders

## 2016-12-22 NOTE — Plan of Care (Signed)
Notified dr Tiburcio Peaharris of pt's cervical dilatation . Orders received for pitocin.

## 2016-12-22 NOTE — Anesthesia Preprocedure Evaluation (Addendum)
Anesthesia Evaluation  Patient identified by MRN, date of birth, ID band Patient awake    Reviewed: Allergy & Precautions, NPO status , Patient's Chart, lab work & pertinent test results  History of Anesthesia Complications Negative for: history of anesthetic complications  Airway Mallampati: III  TM Distance: >3 FB Neck ROM: full    Dental  (+) Poor Dentition   Pulmonary neg pulmonary ROS,           Cardiovascular negative cardio ROS       Neuro/Psych PSYCHIATRIC DISORDERS negative neurological ROS  negative psych ROS   GI/Hepatic negative GI ROS, Neg liver ROS,   Endo/Other  negative endocrine ROS  Renal/GU negative Renal ROS  negative genitourinary   Musculoskeletal negative musculoskeletal ROS (+)   Abdominal   Peds negative pediatric ROS (+)  Hematology negative hematology ROS (+)   Anesthesia Other Findings Past Medical History: No date: Depression  BMI    Body Mass Index:  40.44 kg/m      Reproductive/Obstetrics negative OB ROS                           Anesthesia Physical Anesthesia Plan  ASA: III  Anesthesia Plan: Epidural   Post-op Pain Management:    Induction:   Airway Management Planned:   Additional Equipment:   Intra-op Plan:   Post-operative Plan:   Informed Consent: I have reviewed the patients History and Physical, chart, labs and discussed the procedure including the risks, benefits and alternatives for the proposed anesthesia with the patient or authorized representative who has indicated his/her understanding and acceptance.     Plan Discussed with: CRNA and Anesthesiologist  Anesthesia Plan Comments:        Anesthesia Quick Evaluation

## 2016-12-22 NOTE — Transfer of Care (Signed)
Immediate Anesthesia Transfer of Care Note  Patient: Savannah Callahan  Procedure(s) Performed: Procedure(s): CESAREAN SECTION (N/A)  Patient Location: PACU  Anesthesia Type:Epidural  Level of Consciousness: awake, alert  and oriented  Airway & Oxygen Therapy: Patient Spontanous Breathing  Post-op Assessment: Post -op Vital signs reviewed and stable  Post vital signs: stable  Last Vitals:  Vitals:   12/22/16 1952 12/22/16 2230  BP:  125/69  Pulse:  89  Resp: (!) 22 12  Temp:      Last Pain:  Vitals:   12/22/16 1952  TempSrc:   PainSc: 10-Worst pain ever      Patients Stated Pain Goal: 0 (12/22/16 1919)  Complications: No apparent anesthesia complications

## 2016-12-22 NOTE — Progress Notes (Signed)
  Labor Progress Note   26 y.o. G1P0 @ 5859w4d , admitted for  Pregnancy, Labor Management. Active Labor  Subjective:  Epidural helped w pain' AROM meconium  Objective:  BP 117/62   Pulse (!) 102   Temp 98.5 F (36.9 C) (Oral)   Resp 18   Ht 5\' 5"  (1.651 m)   Wt 243 lb (110.2 kg)   BMI 40.44 kg/m  Abd: mild Extr: trace to 1+ bilateral pedal edema SVE: CERVIX: 9 cm dilated, 100 effaced, +1 station  EFM: FHR: 130 bpm, variability: moderate,  accelerations:  Present,  decelerations:  Absent Toco: Frequency: Every 3-4 minutes Labs: I have reviewed the patient's lab results.   Assessment & Plan:  G1P0 @ 6859w4d, admitted for  Pregnancy and Labor/Delivery Management  1. Pain management: epidural. 2. FWB: FHT category 1.  3. ID: GBS negative 4. Labor management: Cont active mgt of labor, meconium risks discussed All discussed with patient, see orders

## 2016-12-22 NOTE — Discharge Instructions (Signed)

## 2016-12-22 NOTE — H&P (Signed)
OB History & Physical   History of Present Illness:  Chief Complaint: contractions  HPI:  Savannah Callahan is a 26 y.o. G1P0 female at [redacted]w[redacted]d dated by 6 week ultrasound.  Her pregnancy has been complicated by rh negative.    She reports contractions since 9pm last night.   She denies leakage of fluid.   She denies vaginal bleeding.   She reports fetal movement.    Maternal Medical History:   Past Medical History:  Diagnosis Date  . Depression     Past Surgical History:  Procedure Laterality Date  . NO PAST SURGERIES      No Known Allergies  Prior to Admission medications   Medication Sig Start Date End Date Taking? Authorizing Provider  Prenatal Vit-Fe Fumarate-FA (PRENATAL MULTIVITAMIN) TABS tablet Take 1 tablet by mouth daily at 12 noon.   Yes Historical Provider, MD  sertraline (ZOLOFT) 50 MG tablet Take 1 tablet (50 mg total) by mouth daily. Patient not taking: Reported on 12/22/2016 09/07/15   Audery Amel, MD    OB History  Gravida Para Term Preterm AB Living  1            SAB TAB Ectopic Multiple Live Births               # Outcome Date GA Lbr Len/2nd Weight Sex Delivery Anes PTL Lv  1 Current               Prenatal care site: [facility]  Social History: She  reports that she has never smoked. She has never used smokeless tobacco. She reports that she does not drink alcohol or use drugs.  Family History:  Hypertension: MGM, PGM Stroke: MGF, MFM  Review of Systems: Negative x 10 systems reviewed except as noted in the HPI.    Physical Exam:  Vital Signs: BP 125/75 (BP Location: Left Arm)   Pulse (!) 114   Temp 98.2 F (36.8 C) (Oral)   Resp 20   Ht 5\' 5"  (1.651 m)   Wt 243 lb (110.2 kg)   BMI 40.44 kg/m  Constitutional: Well nourished, well developed female in no acute distress.  HEENT: normal Skin: Warm and dry.  Cardiovascular: Regular rate and rhythm.   Extremity: no edema Respiratory: Clear to auscultation bilateral. Normal respiratory  effort Abdomen: gravid, nontender Back: no CVAT Neuro: DTRs 2+, Cranial nerves grossly intact Psych: Alert and Oriented x3. No memory deficits. Normal mood and affect.  MS: normal gait, normal bilateral lower extremity ROM/strength/stability.  Pelvic exam: initially 2.5cm, has been 3.5cm over past two checks, pr RN   Pertinent Results:  Prenatal Labs: Blood type/Rh A negative  Antibody screen negative  Rubella Immune  Varicella Immune    RPR NR  HBsAg negative  HIV negative  GC negative  Chlamydia negative  Genetic screening Declined  1 hour GTT 77  3 hour GTT n/a  GBS negative on 11/25/16   Baseline FHR: 135 beats/min   Variability: moderate   Accelerations: present   Decelerations: absent Contractions: present frequency: 3 q 10 min Overall assessment: category 1  Assessment:  Savannah Callahan is a 26 y.o. G1P0 female at [redacted]w[redacted]d with contractions.   Plan:  1. Admit to Labor & Delivery for observation. Patient initially was making change. However, she has had no change in her exam.  She states that her pain is worse and her pressure is increase. She may likely continue to make change at some point.  Given her fetal  tracing and normal vital signs, I offered her to return to home for her early labor. I also offered her to stay a while longer. She opts to stay for another period of time. 2. GBS negative.   3. Fetwal well-being: reassuring.   Conard NovakJackson, Jalisia Puchalski D, MD 12/22/2016 2:27 AM

## 2016-12-22 NOTE — Plan of Care (Signed)
Epidural rate turned up to 20cc x 1 hour. Marcelino DusterMichelle notified of plan of care.

## 2016-12-22 NOTE — Progress Notes (Signed)
Pt feeling lots of pressure immediately.  Cervix rechecked. Very small amount of cervix noted on pt's right side.  Anesthesia notified. Dr Tiburcio Peaharris notified.

## 2016-12-22 NOTE — Anesthesia Post-op Follow-up Note (Cosign Needed)
Anesthesia QCDR form completed.        

## 2016-12-23 ENCOUNTER — Encounter: Payer: Self-pay | Admitting: Obstetrics & Gynecology

## 2016-12-23 LAB — CBC
HCT: 31 % — ABNORMAL LOW (ref 35.0–47.0)
Hemoglobin: 10.4 g/dL — ABNORMAL LOW (ref 12.0–16.0)
MCH: 31.2 pg (ref 26.0–34.0)
MCHC: 33.6 g/dL (ref 32.0–36.0)
MCV: 92.8 fL (ref 80.0–100.0)
PLATELETS: 163 10*3/uL (ref 150–440)
RBC: 3.34 MIL/uL — AB (ref 3.80–5.20)
RDW: 14.6 % — ABNORMAL HIGH (ref 11.5–14.5)
WBC: 14 10*3/uL — ABNORMAL HIGH (ref 3.6–11.0)

## 2016-12-23 LAB — RPR: RPR Ser Ql: NONREACTIVE

## 2016-12-23 MED ORDER — LACTATED RINGERS IV SOLN: INTRAVENOUS | Status: DC

## 2016-12-23 MED ORDER — KETOROLAC TROMETHAMINE 30 MG/ML IJ SOLN
30.0000 mg | Freq: Four times a day (QID) | INTRAMUSCULAR | Status: DC
  Administered 2016-12-23 (×3): 30 mg via INTRAVENOUS
  Filled 2016-12-23 (×3): qty 1

## 2016-12-23 MED ORDER — MENTHOL 3 MG MT LOZG
1.0000 | LOZENGE | OROMUCOSAL | Status: DC | PRN
  Filled 2016-12-23: qty 9

## 2016-12-23 MED ORDER — MORPHINE SULFATE (PF) 2 MG/ML IV SOLN: 1.0000 mg | INTRAVENOUS | Status: DC | PRN

## 2016-12-23 MED ORDER — OXYCODONE-ACETAMINOPHEN 5-325 MG PO TABS: 2.0000 | ORAL_TABLET | ORAL | Status: DC | PRN

## 2016-12-23 MED ORDER — SENNOSIDES-DOCUSATE SODIUM 8.6-50 MG PO TABS
2.0000 | ORAL_TABLET | ORAL | Status: DC
  Administered 2016-12-23 – 2016-12-24 (×2): 2 via ORAL
  Filled 2016-12-23 (×2): qty 2

## 2016-12-23 MED ORDER — PRENATAL MULTIVITAMIN CH
1.0000 | ORAL_TABLET | Freq: Every day | ORAL | Status: DC
  Administered 2016-12-23 – 2016-12-25 (×4): 1 via ORAL
  Filled 2016-12-23 (×4): qty 1

## 2016-12-23 MED ORDER — DIPHENHYDRAMINE HCL 25 MG PO CAPS: 25.0000 mg | ORAL_CAPSULE | Freq: Four times a day (QID) | ORAL | Status: DC | PRN

## 2016-12-23 MED ORDER — WITCH HAZEL-GLYCERIN EX PADS: 1.0000 "application " | MEDICATED_PAD | CUTANEOUS | Status: DC | PRN

## 2016-12-23 MED ORDER — COCONUT OIL OIL
1.0000 "application " | TOPICAL_OIL | Status: DC | PRN
  Administered 2016-12-23: 1 via TOPICAL
  Filled 2016-12-23: qty 120

## 2016-12-23 MED ORDER — OXYCODONE-ACETAMINOPHEN 5-325 MG PO TABS
1.0000 | ORAL_TABLET | ORAL | Status: DC | PRN
  Administered 2016-12-23 – 2016-12-25 (×5): 1 via ORAL
  Filled 2016-12-23 (×5): qty 1

## 2016-12-23 MED ORDER — ACETAMINOPHEN 325 MG PO TABS: 650.0000 mg | ORAL_TABLET | ORAL | Status: DC | PRN

## 2016-12-23 MED ORDER — OXYTOCIN 40 UNITS IN LACTATED RINGERS INFUSION - SIMPLE MED: 2.5000 [IU]/h | INTRAVENOUS | Status: AC

## 2016-12-23 MED ORDER — SIMETHICONE 80 MG PO CHEW: 80.0000 mg | CHEWABLE_TABLET | ORAL | Status: DC

## 2016-12-23 MED ORDER — SIMETHICONE 80 MG PO CHEW
80.0000 mg | CHEWABLE_TABLET | Freq: Three times a day (TID) | ORAL | Status: DC
  Administered 2016-12-23 – 2016-12-24 (×6): 80 mg via ORAL
  Filled 2016-12-23 (×7): qty 1

## 2016-12-23 MED ORDER — SIMETHICONE 80 MG PO CHEW: 80.0000 mg | CHEWABLE_TABLET | ORAL | Status: DC | PRN

## 2016-12-23 MED ORDER — BUPIVACAINE 0.25 % ON-Q PUMP DUAL CATH 400 ML
400.0000 mL | INJECTION | Status: DC
  Filled 2016-12-23: qty 400

## 2016-12-23 MED ORDER — ZOLPIDEM TARTRATE 5 MG PO TABS: 5.0000 mg | ORAL_TABLET | Freq: Every evening | ORAL | Status: DC | PRN

## 2016-12-23 MED ORDER — IBUPROFEN 600 MG PO TABS
600.0000 mg | ORAL_TABLET | Freq: Four times a day (QID) | ORAL | Status: DC | PRN
  Administered 2016-12-23 – 2016-12-25 (×6): 600 mg via ORAL
  Filled 2016-12-23 (×6): qty 1

## 2016-12-23 MED ORDER — DIBUCAINE 1 % RE OINT: 1.0000 "application " | TOPICAL_OINTMENT | RECTAL | Status: DC | PRN

## 2016-12-23 NOTE — Anesthesia Postprocedure Evaluation (Signed)
Anesthesia Post Note  Patient: Savannah Callahan  Procedure(s) Performed: Procedure(s) (LRB): CESAREAN SECTION (N/A)  Patient location during evaluation: Mother Baby Anesthesia Type: Epidural Level of consciousness: awake and alert Pain management: pain level controlled Vital Signs Assessment: post-procedure vital signs reviewed and stable Respiratory status: spontaneous breathing, nonlabored ventilation and respiratory function stable Cardiovascular status: stable Postop Assessment: no headache, no backache and epidural receding Anesthetic complications: no Comments: Labor epidural used for c-section.     Last Vitals:  Vitals:   12/23/16 0406 12/23/16 0539  BP: 109/61 (!) 101/54  Pulse: 98 83  Resp: 16 16  Temp: 37.3 C 36.8 C    Last Pain:  Vitals:   12/23/16 0539  TempSrc: Oral  PainSc:                  Starling Mannsurtis,  Youssouf Shipley A

## 2016-12-23 NOTE — Anesthesia Post-op Follow-up Note (Signed)
  Anesthesia Pain Follow-up Note  Patient: Savannah Callahan  Day #: 1  Date of Follow-up: 12/23/2016 Time: 7:48 AM  Last Vitals:  Vitals:   12/23/16 0406 12/23/16 0539  BP: 109/61 (!) 101/54  Pulse: 98 83  Resp: 16 16  Temp: 37.3 C 36.8 C    Level of Consciousness: alert  Pain: none   Side Effects:None  Catheter Site Exam:clean, dry, no drainage     Plan: D/C from anesthesia care at surgeon's request  Starling MannsCurtis,  Nichoals Heyde A

## 2016-12-23 NOTE — Progress Notes (Signed)
Epidural infusion pump increased as per Dr Piscitello's instructions to 20 ml/h. Pt and spouse informed

## 2016-12-23 NOTE — Progress Notes (Signed)
Subjective:  Doing well no concerns.  Pain well controlled.  Minimal lochia  Objective:  Blood pressure 105/62, pulse 87, temperature 98 F (36.7 C), temperature source Oral, resp. rate 17, height 5\' 5"  (1.651 m), weight 243 lb (110.2 kg), SpO2 95 %.  General: NAD Pulmonary: no increased work of breathing Abdomen: non-distended, non-tender, fundus firm at level of umbilicus Incision: D/C/I Extremities: no edema, no erythema, no tenderness  Results for orders placed or performed during the hospital encounter of 12/21/16 (from the past 72 hour(s))  CBC     Status: Abnormal   Collection Time: 12/22/16  9:39 AM  Result Value Ref Range   WBC 14.7 (H) 3.6 - 11.0 K/uL   RBC 4.13 3.80 - 5.20 MIL/uL   Hemoglobin 13.3 12.0 - 16.0 g/dL   HCT 16.137.6 09.635.0 - 04.547.0 %   MCV 91.0 80.0 - 100.0 fL   MCH 32.2 26.0 - 34.0 pg   MCHC 35.4 32.0 - 36.0 g/dL   RDW 40.914.4 81.111.5 - 91.414.5 %   Platelets 214 150 - 440 K/uL  Type and screen [facility]     Status: None (Preliminary result)   Collection Time: 12/22/16  9:39 AM  Result Value Ref Range   ABO/RH(D) A NEG    Antibody Screen POS    Sample Expiration 12/25/2016    Antibody Identification PASSIVELY ACQUIRED ANTI-D    Unit Number [id number]    Blood Component Type RED CELLS,LR    Unit division 00    Status of Unit ALLOCATED    Transfusion Status OK TO TRANSFUSE    Crossmatch Result COMPATIBLE    Unit Number [id number]    Blood Component Type RED CELLS,LR    Unit division 00    Status of Unit ALLOCATED    Transfusion Status OK TO TRANSFUSE    Crossmatch Result COMPATIBLE   RPR     Status: None   Collection Time: 12/22/16  9:39 AM  Result Value Ref Range   RPR Ser Ql Non Reactive Non Reactive    Comment: (NOTE) Performed At: [facility]  [address] [place], [place] [id number] Mila HomerHancock William F MD OZ:[id number]:[phone]   ABO/Rh     Status: None   Collection Time: 12/22/16  2:57 PM  Result Value Ref Range   ABO/RH(D) A NEG   CBC     Status: Abnormal   Collection Time: 12/23/16  5:08 AM  Result Value Ref Range   WBC 14.0 (H) 3.6 - 11.0 K/uL   RBC 3.34 (L) 3.80 - 5.20 MIL/uL   Hemoglobin 10.4 (L) 12.0 - 16.0 g/dL   HCT 25.931.0 (L) 56.335.0 - 87.547.0 %   MCV 92.8 80.0 - 100.0 fL   MCH 31.2 26.0 - 34.0 pg   MCHC 33.6 32.0 - 36.0 g/dL   RDW 64.314.6 (H) 32.911.5 - 51.814.5 %   Platelets 163 150 - 440 K/uL     Assessment:   26 y.o. G1P0 postoperativeday # 1 1LTCS   Plan:  1) Acute blood loss anemia - hemodynamically stable and asymptomatic - po ferrous sulfate  2) --/--/A NEG (02/01 1457) / RI / VZI -  Information for the patient's newborn:  Faythe Dingwallatterson, Boy Jere [[id number]][[id number]]  A NEG   3) TDAP status - verify received  4) Breast/camila  5) Disposition anticipate discharge POD3

## 2016-12-24 NOTE — Progress Notes (Signed)
Patient ID: Savannah Callahan, female   DOB: 09/13/1991, 26 y.o.   MRN: [id number] Obstetric Postpartum/PostOperative Daily Progress Note Subjective:  26 y.o. G1P0 POD#2 status post cesarean delivery.  She is ambulating, is tolerating po, is voiding spontaneously.  Her pain is well controlled on PO pain medications. Her lochia is less than menses.   Medications SCHEDULED MEDICATIONS  . prenatal multivitamin  1 tablet Oral Q1200  . senna-docusate  2 tablet Oral Q24H  . simethicone  80 mg Oral TID PC    MEDICATION INFUSIONS  . bupivacaine 0.25 % ON-Q pump DUAL CATH 400 mL    . lactated ringers      PRN MEDICATIONS  acetaminophen, coconut oil, witch hazel-glycerin **AND** dibucaine, diphenhydrAMINE **OR** diphenhydrAMINE, ibuprofen, menthol-cetylpyridinium, nalbuphine **OR** nalbuphine, ondansetron (ZOFRAN) IV, oxyCODONE-acetaminophen, oxyCODONE-acetaminophen, [DISCONTINUED] naloxone **AND** sodium chloride flush, zolpidem    Objective:   Vitals:   12/23/16 1955 12/24/16 0001 12/24/16 0300 12/24/16 0745  BP: 112/70   120/79  Pulse: 81   78  Resp: 18   18  Temp: 98 F (36.7 C) 98.9 F (37.2 C) 97.7 F (36.5 C) 97.9 F (36.6 C)  TempSrc: Oral Oral Oral Oral  SpO2:    100%  Weight:      Height:        Current Vital Signs 24h Vital Sign Ranges  T 97.9 F (36.6 C) Temp  Avg: 98.1 F (36.7 C)  Min: 97.7 F (36.5 C)  Max: 98.9 F (37.2 C)  BP 120/79 BP  Min: 101/65  Max: 120/79  HR 78 Pulse  Avg: 80.8  Min: 78  Max: 83  RR 18 Resp  Avg: 18  Min: 18  Max: 18  SaO2 100 % Not Delivered SpO2  Avg: 99.7 %  Min: 99 %  Max: 100 %       24 Hour I/O Current Shift I/O  Time Ins Outs 02/02 0701 - 02/03 0700 In: -  Out: 2000 [Urine:2000] No intake/output data recorded.  General: NAD Pulmonary: no increased work of breathing Abdomen: non-distended, non-tender, fundus firm at level of umbilicus Inc: Clean/dry/intact Extremities: no edema, no erythema, no tenderness  Labs:   Recent  Labs Lab 12/22/16 0939 12/23/16 0508  WBC 14.7* 14.0*  HGB 13.3 10.4*  HCT 37.6 31.0*  PLT 214 163     Assessment:   26 y.o. G1P0 postoperative day # 2, s/p cesarean section, doing well  Plan:  1) Acute blood loss anemia - hemodynamically stable and asymptomatic - po ferrous sulfate  2) A NEG / Rubella  / Varicella Immune  3) TDAP status declined, Flu vaccine - declined  4) breast /Contraception = oral progesterone-only contraceptive  5) Disposition: home tomorrow  Conard NovakStephen D. Cassandre Oleksy, MD 12/24/2016 10:39 AM

## 2016-12-25 MED ORDER — OXYCODONE-ACETAMINOPHEN 5-325 MG PO TABS: 2.0000 | ORAL_TABLET | ORAL | 0 refills | Status: DC | PRN

## 2016-12-25 MED ORDER — NORETHINDRONE 0.35 MG PO TABS: 1.0000 | ORAL_TABLET | Freq: Every day | ORAL | 1 refills | Status: DC

## 2016-12-25 NOTE — Progress Notes (Signed)
Discharge summary reviewed with pt.  Rx given for home use.  Verb u/o

## 2016-12-25 NOTE — Progress Notes (Signed)
Discharge home to car via wc

## 2016-12-26 LAB — TYPE AND SCREEN
ABO/RH(D): A NEG
ANTIBODY SCREEN: POSITIVE
UNIT DIVISION: 0
UNIT DIVISION: 0

## 2017-01-18 ENCOUNTER — Telehealth: Payer: Self-pay

## 2017-01-18 NOTE — Telephone Encounter (Signed)
Pt is s/p c/s 12/22/16.  She's noticed puss around her incision. What to do or does she need to be seen?

## 2017-01-18 NOTE — Telephone Encounter (Signed)
Needs work in appt any provider

## 2017-01-20 ENCOUNTER — Ambulatory Visit (INDEPENDENT_AMBULATORY_CARE_PROVIDER_SITE_OTHER): Payer: BLUE CROSS/BLUE SHIELD | Admitting: Obstetrics & Gynecology

## 2017-01-20 ENCOUNTER — Encounter: Payer: Self-pay | Admitting: Obstetrics & Gynecology

## 2017-01-20 DIAGNOSIS — T8131XA Disruption of external operation (surgical) wound, not elsewhere classified, initial encounter: Secondary | ICD-10-CM | POA: Diagnosis not present

## 2017-01-20 NOTE — Telephone Encounter (Signed)
Pt saw PH today.  Documentation in [facility].

## 2017-02-07 ENCOUNTER — Ambulatory Visit: Payer: BLUE CROSS/BLUE SHIELD | Admitting: Obstetrics & Gynecology

## 2017-03-16 ENCOUNTER — Encounter: Payer: Self-pay | Admitting: Obstetrics & Gynecology

## 2017-03-20 ENCOUNTER — Other Ambulatory Visit: Payer: Self-pay | Admitting: Obstetrics & Gynecology

## 2017-03-20 MED ORDER — NORGESTIMATE-ETH ESTRADIOL 0.25-35 MG-MCG PO TABS: 1.0000 | ORAL_TABLET | Freq: Every day | ORAL | 3 refills | Status: DC

## 2017-06-01 ENCOUNTER — Encounter: Payer: Self-pay | Admitting: Physician Assistant

## 2017-06-01 ENCOUNTER — Ambulatory Visit (INDEPENDENT_AMBULATORY_CARE_PROVIDER_SITE_OTHER): Payer: BLUE CROSS/BLUE SHIELD | Admitting: Physician Assistant

## 2017-06-01 VITALS — BP 96/72 | HR 63 | Temp 97.9°F | Ht 65.0 in | Wt 204.6 lb

## 2017-06-01 DIAGNOSIS — K644 Residual hemorrhoidal skin tags: Secondary | ICD-10-CM | POA: Insufficient documentation

## 2017-06-01 DIAGNOSIS — Z0000 Encounter for general adult medical examination without abnormal findings: Secondary | ICD-10-CM

## 2017-06-01 DIAGNOSIS — F3281 Premenstrual dysphoric disorder: Secondary | ICD-10-CM | POA: Diagnosis not present

## 2017-06-01 MED ORDER — NORGESTIMATE-ETH ESTRADIOL 0.25-35 MG-MCG PO TABS: ORAL_TABLET | ORAL | 3 refills | Status: DC

## 2017-06-01 NOTE — Progress Notes (Signed)
Patient: Savannah Callahan Female    DOB: 04/30/1991   26 y.o.   MRN: [id number] Visit Date: 06/01/2017  Today's Provider: Trey SailorsAdriana M Garrie Woodin, PA-C   Chief Complaint  Patient presents with  . Establish Care   Subjective:    HPI Savannah Callahan is a 26 year old female who presents today to Establish Care as a new patient. Patient did not have a previous PCP she would go to Urgent Care for acute visits.  [place] ago after completing pharmacy school. 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  She has been married for five years. She has one biological child, 5 months, and three step children. Things going okay with marriage. Lives in [place] family.  She has a history of depression, previously on Lexapro. No longer taking lexapro, feels her mood is stable.   Concerned about hair loss, cold intolerance, and fatigue prior to birth of her child. She is requesting thyroid testing.  Saw [facility] for OBGYN care when pregnant, had pap earlier this year. She was on Quartet recently until insurance stopped covering it. Then she changed to Sprintec. She was wondering if there was anything similar to this that may be covered by insurance. She did well with the longer periods of hormones as she gets extremely moody and irritable around her menstrual cycle. She does not smoke, does not have hx of DVT or stroke, no breast cancer.     Previous Medications   MULTIPLE VITAMIN (MULTIVITAMIN) TABLET    Take 1 tablet by mouth daily.    Review of Systems  Constitutional: Positive for fatigue.       Irritability    HENT: Negative.   Eyes: Negative.   Respiratory: Negative.   Cardiovascular: Negative.   Gastrointestinal: Negative.   Endocrine: Positive for cold intolerance.       Excessive hair loss  Genitourinary: Negative.   Musculoskeletal: Negative.   Skin: Negative.   Allergic/Immunologic: Negative.   Neurological: Negative.   Hematological: Negative.    Psychiatric/Behavioral: Negative.     Social History  Substance Use Topics  . Smoking status: Never Smoker  . Smokeless tobacco: Never Used  . Alcohol use Yes     Comment: rarely   Objective:   BP 96/72 (BP Location: Left Arm, Patient Position: Sitting, Cuff Size: Normal)   Pulse 63   Temp 97.9 F (36.6 C) (Oral)   Ht 5\' 5"  (1.651 m)   Wt 204 lb 9.6 oz (92.8 kg)   LMP 05/28/2017   SpO2 99%   BMI 34.05 kg/m   Physical Exam  Constitutional: She is oriented to person, place, and time. She appears well-developed and well-nourished.  HENT:  Right Ear: Tympanic membrane normal.  Left Ear: Tympanic membrane normal.  Mouth/Throat: Oropharynx is clear and moist. No oropharyngeal exudate.  Eyes: Conjunctivae are normal.  Neck: Neck supple.  Cardiovascular: Normal rate and regular rhythm.   Pulmonary/Chest: Effort normal and breath sounds normal.  Abdominal: Soft. Bowel sounds are normal.  Lymphadenopathy:    She has no cervical adenopathy.  Neurological: She is alert and oriented to person, place, and time.  Skin: Skin is warm and dry.  Psychiatric: She has a normal mood and affect. Her behavior is normal.        Assessment & Plan:     1. Annual physical exam  Baseline labs as below. Will need immunization records from her previous PCP, she has this and will bring into  office. Sign ROI so we can request PAP from [facility].  - Comprehensive metabolic panel - TSH - Lipid panel - CBC with Differential/Platelet  2. External hemorrhoid  Stable.  3. Premenstrual dysphoric disorder  Insurance stopped covering Quartette. Suggested she may take her regular birth control continuously so as to avoid menstrual cycle and mood fluctuations associated. Will prescribe continuously as below.  - norgestimate-ethinyl estradiol (SPRINTEC 28) 0.25-35 MG-MCG tablet; Take continuously. Do not take placebo pill.  Dispense: 4 Package; Refill: 3    Follow up: Return in about 1 year  (around 06/01/2018) for CPE.

## 2017-06-01 NOTE — Patient Instructions (Signed)

## 2017-06-07 ENCOUNTER — Encounter: Payer: Self-pay | Admitting: Physician Assistant

## 2017-06-10 LAB — COMPREHENSIVE METABOLIC PANEL
ALT: 9 IU/L (ref 0–32)
AST: 14 IU/L (ref 0–40)
Albumin/Globulin Ratio: 1.3 (ref 1.2–2.2)
Albumin: 3.9 g/dL (ref 3.5–5.5)
Alkaline Phosphatase: 46 IU/L (ref 39–117)
BUN/Creatinine Ratio: 13 (ref 9–23)
BUN: 11 mg/dL (ref 6–20)
Bilirubin Total: 0.2 mg/dL (ref 0.0–1.2)
CO2: 24 mmol/L (ref 20–29)
Calcium: 9.1 mg/dL (ref 8.7–10.2)
Chloride: 105 mmol/L (ref 96–106)
Creatinine, Ser: 0.85 mg/dL (ref 0.57–1.00)
GFR calc Af Amer: 108 mL/min/{1.73_m2} (ref >59)
GFR calc non Af Amer: 94 mL/min/{1.73_m2} (ref >59)
Globulin, Total: 2.9 g/dL (ref 1.5–4.5)
Glucose: 92 mg/dL (ref 65–99)
Potassium: 4.6 mmol/L (ref 3.5–5.2)
Sodium: 144 mmol/L (ref 134–144)
Total Protein: 6.8 g/dL (ref 6.0–8.5)

## 2017-06-10 LAB — TSH: TSH: 1.71 u[IU]/mL (ref 0.450–4.500)

## 2017-06-10 LAB — CBC WITH DIFFERENTIAL/PLATELET
Basophils Absolute: 0 10*3/uL (ref 0.0–0.2)
Basos: 0 %
EOS (ABSOLUTE): 0.1 10*3/uL (ref 0.0–0.4)
Eos: 1 %
Hematocrit: 41.4 % (ref 34.0–46.6)
Hemoglobin: 13.6 g/dL (ref 11.1–15.9)
Immature Grans (Abs): 0 10*3/uL (ref 0.0–0.1)
Immature Granulocytes: 0 %
Lymphocytes Absolute: 2.4 10*3/uL (ref 0.7–3.1)
Lymphs: 34 %
MCH: 30.7 pg (ref 26.6–33.0)
MCHC: 32.9 g/dL (ref 31.5–35.7)
MCV: 94 fL (ref 79–97)
Monocytes Absolute: 0.8 10*3/uL (ref 0.1–0.9)
Monocytes: 11 %
Neutrophils Absolute: 3.8 10*3/uL (ref 1.4–7.0)
Neutrophils: 54 %
Platelets: 263 10*3/uL (ref 150–379)
RBC: 4.43 x10E6/uL (ref 3.77–5.28)
RDW: 13.1 % (ref 12.3–15.4)
WBC: 7.1 10*3/uL (ref 3.4–10.8)

## 2017-06-10 LAB — LIPID PANEL
Chol/HDL Ratio: 2.1 ratio (ref 0.0–4.4)
Cholesterol, Total: 140 mg/dL (ref 100–199)
HDL: 68 mg/dL (ref >39)
LDL Calculated: 56 mg/dL (ref 0–99)
Triglycerides: 82 mg/dL (ref 0–149)
VLDL Cholesterol Cal: 16 mg/dL (ref 5–40)

## 2017-07-27 ENCOUNTER — Ambulatory Visit (INDEPENDENT_AMBULATORY_CARE_PROVIDER_SITE_OTHER): Payer: BLUE CROSS/BLUE SHIELD | Admitting: Physician Assistant

## 2017-07-27 ENCOUNTER — Encounter: Payer: Self-pay | Admitting: Physician Assistant

## 2017-07-27 VITALS — BP 102/60 | HR 84 | Temp 98.5°F | Resp 16 | Wt 207.4 lb

## 2017-07-27 DIAGNOSIS — L237 Allergic contact dermatitis due to plants, except food: Secondary | ICD-10-CM | POA: Diagnosis not present

## 2017-07-27 DIAGNOSIS — L255 Unspecified contact dermatitis due to plants, except food: Secondary | ICD-10-CM

## 2017-07-27 MED ORDER — PREDNISONE 10 MG (21) PO TBPK: ORAL_TABLET | ORAL | 0 refills | Status: DC

## 2017-07-27 NOTE — Progress Notes (Signed)
       Patient: Savannah Callahan Female    DOB: 02/12/1991   26 y.o.   MRN: [id number] Visit Date: 07/27/2017  Today's Provider: Margaretann LovelessJennifer M Oluwanifemi Susman, PA-C   Chief Complaint  Patient presents with  . Rash   Subjective:    Rash  This is a new problem. The current episode started 1 to 4 weeks ago (Last Thursday). The problem has been gradually improving since onset. The affected locations include the face, right arm, left arm, chest and right foot. The rash is characterized by itchiness, blistering and redness (rash on her breast hurts a little). She was exposed to plant contact. Pertinent negatives include no cough, facial edema, fatigue, joint pain or shortness of breath. Past treatments include antihistamine and topical steroids. The treatment provided mild relief.      No Known Allergies   Current Outpatient Prescriptions:  Marland Kitchen.  Multiple Vitamin (MULTIVITAMIN) tablet, Take 1 tablet by mouth daily., Disp: , Rfl:  .  norgestimate-ethinyl estradiol (SPRINTEC 28) 0.25-35 MG-MCG tablet, Take continuously. Do not take placebo pill., Disp: 4 Package, Rfl: 3  Review of Systems  Constitutional: Negative for fatigue.  HENT: Negative.   Respiratory: Negative for cough, chest tightness and shortness of breath.   Cardiovascular: Negative for chest pain, palpitations and leg swelling.  Gastrointestinal: Negative for abdominal pain.  Musculoskeletal: Negative for joint pain.  Skin: Positive for rash.    Social History  Substance Use Topics  . Smoking status: Never Smoker  . Smokeless tobacco: Never Used  . Alcohol use Yes     Comment: rarely   Objective:   BP 102/60   Pulse 84   Temp 98.5 F (36.9 C) (Oral)   Resp 16   Wt 207 lb 6.4 oz (94.1 kg)   LMP 07/04/2017   SpO2 97%   BMI 34.51 kg/m     Physical Exam  Constitutional: She appears well-developed and well-nourished. No distress.  Neck: Normal range of motion. Neck supple. No JVD present. No tracheal deviation present. No  thyromegaly present.  Cardiovascular: Normal rate, regular rhythm and normal heart sounds.  Exam reveals no gallop and no friction rub.   No murmur heard. Pulmonary/Chest: Effort normal and breath sounds normal. No respiratory distress. She has no wheezes. She has no rales.  Lymphadenopathy:    She has no cervical adenopathy.  Skin: Rash noted. Rash is vesicular (diffuse, vesicular lesions on erythematous base in linear distribution). She is not diaphoretic.     Vitals reviewed.       Assessment & Plan:     1. Rhus dermatitis Prednisone taper given as below. May continue topical steroid for itching, benadryl for itching. Cool showers. Call if symptoms worsen. - predniSONE (STERAPRED UNI-PAK 21 TAB) 10 MG (21) TBPK tablet; Take as directed on package instructions  Dispense: 21 tablet; Refill: 0       Margaretann LovelessJennifer M Davinia Riccardi, PA-C  [facility]

## 2017-07-27 NOTE — Patient Instructions (Signed)

## 2017-09-06 ENCOUNTER — Encounter: Payer: Self-pay | Admitting: Physician Assistant

## 2017-09-07 ENCOUNTER — Telehealth: Payer: Self-pay | Admitting: Physician Assistant

## 2017-09-07 NOTE — Telephone Encounter (Signed)
ROI ([facility]) [facility]  [facility], forward 3 pages to [employer] Corporationdriana-PA

## 2018-01-30 ENCOUNTER — Encounter: Payer: Self-pay | Admitting: Physician Assistant

## 2018-01-31 NOTE — Telephone Encounter (Signed)
Left message to schedule an appointment.   Thanks,   -Shariq Puig 

## 2018-02-12 ENCOUNTER — Ambulatory Visit: Payer: BLUE CROSS/BLUE SHIELD | Admitting: Physician Assistant

## 2018-02-12 ENCOUNTER — Encounter: Payer: Self-pay | Admitting: Family Medicine

## 2018-02-12 ENCOUNTER — Encounter: Payer: Self-pay | Admitting: Physician Assistant

## 2018-02-12 VITALS — BP 122/70 | HR 74 | Temp 98.2°F | Wt 194.0 lb

## 2018-02-12 DIAGNOSIS — Z113 Encounter for screening for infections with a predominantly sexual mode of transmission: Secondary | ICD-10-CM | POA: Diagnosis not present

## 2018-02-12 DIAGNOSIS — B001 Herpesviral vesicular dermatitis: Secondary | ICD-10-CM

## 2018-02-12 DIAGNOSIS — Z3040 Encounter for surveillance of contraceptives, unspecified: Secondary | ICD-10-CM | POA: Diagnosis not present

## 2018-02-12 DIAGNOSIS — L709 Acne, unspecified: Secondary | ICD-10-CM

## 2018-02-12 DIAGNOSIS — Z114 Encounter for screening for human immunodeficiency virus [HIV]: Secondary | ICD-10-CM | POA: Diagnosis not present

## 2018-02-12 MED ORDER — VALACYCLOVIR HCL 1 G PO TABS: 1000.0000 mg | ORAL_TABLET | Freq: Two times a day (BID) | ORAL | 2 refills | Status: DC

## 2018-02-12 MED ORDER — TRETINOIN 0.05 % EX CREA: TOPICAL_CREAM | Freq: Every day | CUTANEOUS | 0 refills | Status: DC

## 2018-02-12 NOTE — Progress Notes (Signed)
Patient: Savannah Callahan Female    DOB: 03/27/1991   27 y.o.   MRN: [id number] Visit Date: 02/12/2018  Today's Provider: Trey Sailors, PA-C   Chief Complaint  Patient presents with  . Contraception    Would like to discuss different options.  . Mouth Lesions    Needs RX   . Scar    Acne Scarring    Subjective:    HPI   Savannah Callahan is a 27 y/o woman presenting today for follow up of irregular menses, cold sores, acne, and STI screening.  She had been on prior OCP cortette which worked well but was too expensive. Changed to Sprintec and she was having intolerable menstrual symptoms so advised to take it continuously. Still having bleeding Q4 weeks with this. She says it is also costly. Wonders about other birth control options. She does not desire to be pregnant again. Never had heart attack, blood clot, or stroke. She is currently on Phentermine for weight loss.  She has a history of cold sores, would like Rx for valtrex.  Also has acne, would like to try tretinoin. Tends towards dry skin.   Would also like STI screening.    No Known Allergies   Current Outpatient Medications:  Marland Kitchen  Multiple Vitamin (MULTIVITAMIN) tablet, Take 1 tablet by mouth daily., Disp: , Rfl:  .  norgestimate-ethinyl estradiol (SPRINTEC 28) 0.25-35 MG-MCG tablet, Take continuously. Do not take placebo pill., Disp: 4 Package, Rfl: 3 .  predniSONE (STERAPRED UNI-PAK 21 TAB) 10 MG (21) TBPK tablet, Take as directed on package instructions, Disp: 21 tablet, Rfl: 0  Review of Systems  Constitutional: Negative.   Respiratory: Negative.   Cardiovascular: Negative.   Gastrointestinal: Negative.   Neurological: Negative for dizziness, light-headedness and headaches.    Social History   Tobacco Use  . Smoking status: Never Smoker  . Smokeless tobacco: Never Used  Substance Use Topics  . Alcohol use: Yes    Comment: rarely   Objective:   BP 122/70 (BP Location: Right Arm, Patient  Position: Sitting, Cuff Size: Normal)   Pulse 74   Temp 98.2 F (36.8 C) (Oral)   Wt 194 lb (88 kg)   LMP 01/22/2018   SpO2 99%   BMI 32.28 kg/m  Vitals:   02/12/18 1304  BP: 122/70  Pulse: 74  Temp: 98.2 F (36.8 C)  TempSrc: Oral  SpO2: 99%  Weight: 194 lb (88 kg)     Physical Exam  Constitutional: She is oriented to person, place, and time. She appears well-developed and well-nourished.  Cardiovascular: Normal rate.  Pulmonary/Chest: Effort normal.  Neurological: She is alert and oriented to person, place, and time.  Skin: Skin is warm and dry.     Psychiatric: She has a normal mood and affect. Her behavior is normal.        Assessment & Plan:     1. Encounter for surveillance of contraceptives, unspecified contraceptive  Counseled on other birth control options. She is interested in Depo Shot. I do not think this would align with her goals of helping irregular bleeding. Also causes weight gain and she is on Phentermine. Welcome to try other regimens/patterns of OCP but think IUD would have best chance of menstrual cessation and long term contraception.  2. Acne, unspecified acne type  - tretinoin (RETIN-A) 0.05 % cream; Apply topically at bedtime.  Dispense: 45 g; Refill: 0  3. Cold sore  - valACYclovir (VALTREX) 1000 MG tablet;  Take 1 tablet (1,000 mg total) by mouth 2 (two) times daily.  Dispense: 30 tablet; Refill: 2  4. Encounter for screening for HIV  - HIV antibody (with reflex)  5. Routine screening for STI (sexually transmitted infection)  She would like additional screening. Instructed she may have cervical swab when she gets IUD done to check for gonorrhea, chlamydia, and trichomonas.   - RPR - HSV(herpes simplex vrs) 1+2 ab-IgG  Return in about 1 month (around 03/15/2018) for IUD placement .  The entirety of the information documented in the History of Present Illness, Review of Systems and Physical Exam were personally obtained by me.  Portions of this information were initially documented by Kavin LeechLaura Walsh, CMA and reviewed by me for thoroughness and accuracy.         Trey SailorsAdriana M Pollak, PA-C  [facility]

## 2018-02-12 NOTE — Patient Instructions (Signed)

## 2018-02-13 ENCOUNTER — Telehealth: Payer: Self-pay

## 2018-02-13 LAB — HSV(HERPES SIMPLEX VRS) I + II AB-IGG
HSV 1 Glycoprotein G Ab, IgG: 21.8 index — ABNORMAL HIGH (ref 0.00–0.90)
HSV 2 IgG, Type Spec: <0.91 index (ref 0.00–0.90)

## 2018-02-13 LAB — RPR: RPR Ser Ql: NONREACTIVE

## 2018-02-13 LAB — HIV ANTIBODY (ROUTINE TESTING W REFLEX): HIV Screen 4th Generation wRfx: NONREACTIVE

## 2018-02-13 NOTE — Telephone Encounter (Signed)
-----   Message from Trey SailorsAdriana M Pollak, [place]-C sent at 02/13/2018  9:14 AM EDT ----- HIV and RPR negative. 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 As we discussed in clinic, positive HSV does not tell you where it is on the body.

## 2018-02-13 NOTE — Telephone Encounter (Signed)
Pt advised.

## 2018-02-23 ENCOUNTER — Other Ambulatory Visit: Payer: Self-pay | Admitting: Physician Assistant

## 2018-02-23 DIAGNOSIS — L709 Acne, unspecified: Secondary | ICD-10-CM

## 2018-02-27 ENCOUNTER — Ambulatory Visit: Payer: BLUE CROSS/BLUE SHIELD | Admitting: Family Medicine

## 2018-02-27 ENCOUNTER — Encounter: Payer: Self-pay | Admitting: Family Medicine

## 2018-02-27 VITALS — BP 104/76 | HR 70 | Temp 98.3°F | Resp 16 | Wt 191.0 lb

## 2018-02-27 DIAGNOSIS — N926 Irregular menstruation, unspecified: Secondary | ICD-10-CM | POA: Diagnosis not present

## 2018-02-27 DIAGNOSIS — N898 Other specified noninflammatory disorders of vagina: Secondary | ICD-10-CM

## 2018-02-27 DIAGNOSIS — Z30430 Encounter for insertion of intrauterine contraceptive device: Secondary | ICD-10-CM

## 2018-02-27 LAB — POCT URINE PREGNANCY: Preg Test, Ur: NEGATIVE

## 2018-02-27 MED ORDER — LEVONORGESTREL 20 MCG/24HR IU IUD
INTRAUTERINE_SYSTEM | Freq: Once | INTRAUTERINE | Status: AC
Start: 2018-02-27 — End: 2018-02-27
  Administered 2018-02-27: 11:00:00 via INTRAUTERINE

## 2018-02-27 NOTE — Patient Instructions (Signed)

## 2018-02-27 NOTE — Progress Notes (Signed)
Patient: Savannah Callahan Female    DOB: Jul 20, 1991   26 y.o.   MRN: [id number] Visit Date: 02/27/2018  Today's Provider: Shirlee Latch, MD   I, Joslyn Hy, CMA, am acting as scribe for Shirlee Latch, MD.  Chief Complaint  Patient presents with  . Contraception   Subjective:    HPI   Pt presents for IUD insertion. She is also c/o vaginal discharge, and believes she has BV. She would like a NuSwab VG Plus STD screening today.  She was seen by PCP 02/12/18 to discuss contraception.  She has had irregular bleeding on OCPs.  They agreed that Depo shot was not in alignment with her desire to lose weight she has delivered 1 baby. this was via C-section after arrest of descent.  She wants reliable contraception and she does not wish to become pregnant again.  No Known Allergies   Current Outpatient Medications:  Marland Kitchen  Multiple Vitamin (MULTIVITAMIN) tablet, Take 1 tablet by mouth daily., Disp: , Rfl:  .  norgestimate-ethinyl estradiol (SPRINTEC 28) 0.25-35 MG-MCG tablet, Take continuously. Do not take placebo pill., Disp: 4 Package, Rfl: 3 .  tretinoin (RETIN-A) 0.05 % cream, APPLY EXTERNALLY TO THE AFFECTED AREA AT BEDTIME, Disp: 45 g, Rfl: 0 .  valACYclovir (VALTREX) 1000 MG tablet, Take 1 tablet (1,000 mg total) by mouth 2 (two) times daily., Disp: 30 tablet, Rfl: 2  Review of Systems  Constitutional: Negative for activity change, appetite change, chills, diaphoresis, fatigue, fever and unexpected weight change.  Genitourinary: Positive for vaginal discharge. Negative for menstrual problem, pelvic pain and vaginal bleeding.    Social History   Tobacco Use  . Smoking status: Never Smoker  . Smokeless tobacco: Never Used  Substance Use Topics  . Alcohol use: Yes    Comment: rarely   Objective:   BP 104/76 (BP Location: Left Arm, Patient Position: Sitting, Cuff Size: Large)   Pulse 70   Temp 98.3 F (36.8 C) (Oral)   Resp 16   Wt 191 lb (86.6 kg)   LMP 02/26/2018    SpO2 99%   BMI 31.78 kg/m  Vitals:   02/27/18 1043  BP: 104/76  Pulse: 70  Resp: 16  Temp: 98.3 F (36.8 C)  TempSrc: Oral  SpO2: 99%  Weight: 191 lb (86.6 kg)     Physical Exam  Constitutional: She is oriented to person, place, and time. She appears well-developed and well-nourished. No distress.  HENT:  Head: Normocephalic and atraumatic.  Cardiovascular: Normal rate and regular rhythm.  Pulmonary/Chest: Effort normal. No respiratory distress.  Abdominal: Soft. She exhibits no distension. There is no tenderness.  Genitourinary:  Genitourinary Comments: GYN:  External genitalia within normal limits.  Vaginal mucosa pink, moist, normal rugae.  Nonfriable cervix without lesions or bleeding noted on speculum exam.  +discharge noted.  No cervical motion tenderness.   Musculoskeletal: She exhibits no edema.  Neurological: She is alert and oriented to person, place, and time.  Skin: Skin is warm and dry. Capillary refill takes less than 2 seconds. No rash noted.  Psychiatric: She has a normal mood and affect. Her behavior is normal.    Results for orders placed or performed in visit on 02/27/18  POCT urine pregnancy  Result Value Ref Range   Preg Test, Ur Negative Negative       Assessment & Plan:   Problem List Items Addressed This Visit    None    Visit Diagnoses    Encounter for insertion  of intrauterine contraceptive device (IUD)    -  Primary   Relevant Orders   POCT urine pregnancy (Completed)   Vaginal discharge       Relevant Orders   NuSwab Vaginitis Plus (VG+)   Irregular menses        - see procedure note for IUD insertion - discussed that Mirena IUD may help with irregular menses without effect on mood or weight gain - testing for STDs given vaginal discharge today -Patient already recently had HIV and RPR testing - d/c OCPs  Return in about 1 month (around 03/27/2018) for string check.   The entirety of the information documented in the History of  Present Illness, Review of Systems and Physical Exam were personally obtained by me. Portions of this information were initially documented by Irving BurtonEmily Ratchford, CMA and reviewed by me for thoroughness and accuracy.    Erasmo DownerBacigalupo, Angela M, MD, MPH [facility] 02/27/2018 11:16 AM

## 2018-02-27 NOTE — Progress Notes (Signed)
IUD Insertion Procedure Note  Pre-operative Diagnosis: desire for contraception, irregular menses  Post-operative Diagnosis: same  Indications: contraception, irregular menses  Procedure Details  Urine pregnancy test was done and result was negative.  The risks (including infection, bleeding, pain, and uterine perforation) and benefits of the procedure were explained to the patient and Written informed consent was obtained.    Cervix cleansed with Betadine. Uterus sounded to 8 cm. IUD inserted without difficulty. String visible and trimmed. Patient tolerated procedure well.  IUD Information: Mirena, Lot # [id number], Expiration date 06/2020.  Condition: Stable  Complications: None  Plan:  The patient was advised to call for any fever or for prolonged or severe pain or bleeding. She was advised to use OTC analgesics as needed for mild to moderate pain.    Erasmo DownerBacigalupo, Harper Smoker M, MD, MPH [facility] 02/27/2018 11:20 AM

## 2018-03-02 ENCOUNTER — Other Ambulatory Visit: Payer: Self-pay | Admitting: Family Medicine

## 2018-03-02 ENCOUNTER — Encounter: Payer: Self-pay | Admitting: Family Medicine

## 2018-03-02 LAB — NUSWAB VAGINITIS PLUS (VG+)
CANDIDA ALBICANS, NAA: NEGATIVE
CANDIDA GLABRATA, NAA: NEGATIVE
Chlamydia trachomatis, NAA: POSITIVE — AB
NEISSERIA GONORRHOEAE, NAA: NEGATIVE
Trich vag by NAA: NEGATIVE

## 2018-03-02 MED ORDER — AZITHROMYCIN 500 MG PO TABS: 1000.0000 mg | ORAL_TABLET | Freq: Once | ORAL | 0 refills | Status: AC

## 2018-03-02 NOTE — Telephone Encounter (Signed)
Please call in Rx for Azithromycin 500mg  tabs. Take 1000mg  once. #2 r0 for patient's husband Savannah Callahan DOB 03/21/83. [facility] [address]  Thanks!  Erasmo DownerBacigalupo, Angela M, MD, MPH [facility] 03/02/2018 11:30 AM

## 2018-03-27 ENCOUNTER — Encounter: Payer: Self-pay | Admitting: Family Medicine

## 2018-03-27 ENCOUNTER — Ambulatory Visit: Payer: BLUE CROSS/BLUE SHIELD | Admitting: Family Medicine

## 2018-03-27 VITALS — BP 114/60 | HR 70 | Temp 98.0°F | Resp 16 | Wt 191.0 lb

## 2018-03-27 DIAGNOSIS — A749 Chlamydial infection, unspecified: Secondary | ICD-10-CM | POA: Diagnosis not present

## 2018-03-27 DIAGNOSIS — Z30431 Encounter for routine checking of intrauterine contraceptive device: Secondary | ICD-10-CM | POA: Diagnosis not present

## 2018-03-27 NOTE — Progress Notes (Signed)
Patient: Savannah Callahan Female    DOB: December 04, 1990   27 y.o.   MRN: [id number] Visit Date: 03/27/2018  Today's Provider: Shirlee Latch, MD   I, Joslyn Hy, CMA, am acting as scribe for Shirlee Latch, MD.  Chief Complaint  Patient presents with  . IUD String Check   Subjective:    HPI   Pt presents for IUD string check. Mirena was placed 02/27/2018. Pt states she was bleeding for 20 days after the insertion, and starting taking 0.5 tabs of Sprintec to jelp the bleeding, with relief. Pt states her husband has informed her that he can "feel" the IUD string. This is not painful, "it only feels weird".  States that life has been stressful since last seen.  She found out she was chlamydia positive.  Took treatment, as did husband.  She denies any symptoms currently, including fever, abdominal pain, cramping, vaginal discharge.  She found out after she tested positive for chlamydia that her husband had another sexual partner.  The sexual partner was also positive for trichomonas.  Even though the patient had tested negative for trichomonas, she did take Flagyl prophylactically for trichomonas.  She states that she has resumed counseling and seeing a psychiatrist.  She is considering resuming her SSRI to help with some depressive symptoms during this difficult time  No Known Allergies   Current Outpatient Medications:  .  levonorgestrel (MIRENA) 20 MCG/24HR IUD, 1 each by Intrauterine route once., Disp: , Rfl:  .  Multiple Vitamin (MULTIVITAMIN) tablet, Take 1 tablet by mouth daily., Disp: , Rfl:  .  tretinoin (RETIN-A) 0.05 % cream, APPLY EXTERNALLY TO THE AFFECTED AREA AT BEDTIME, Disp: 45 g, Rfl: 0 .  valACYclovir (VALTREX) 1000 MG tablet, Take 1 tablet (1,000 mg total) by mouth 2 (two) times daily., Disp: 30 tablet, Rfl: 2  Review of Systems  Constitutional: Negative for activity change, appetite change, chills, diaphoresis, fatigue, fever and unexpected weight change.    Gastrointestinal: Negative for abdominal pain.  Genitourinary: Positive for vaginal bleeding. Negative for pelvic pain, vaginal discharge and vaginal pain.    Social History   Tobacco Use  . Smoking status: Never Smoker  . Smokeless tobacco: Never Used  Substance Use Topics  . Alcohol use: Yes    Comment: rarely   Objective:   BP 114/60 (BP Location: Left Arm, Patient Position: Sitting, Cuff Size: Large)   Pulse 70   Temp 98 F (36.7 C) (Oral)   Resp 16   Wt 191 lb (86.6 kg)   LMP 02/26/2018   SpO2 96%   BMI 31.78 kg/m  Vitals:   03/27/18 1129  BP: 114/60  Pulse: 70  Resp: 16  Temp: 98 F (36.7 C)  TempSrc: Oral  SpO2: 96%  Weight: 191 lb (86.6 kg)     Physical Exam  Constitutional: She is oriented to person, place, and time. She appears well-developed and well-nourished. No distress.  Cardiovascular: Normal rate and regular rhythm.  Pulmonary/Chest: Effort normal. No respiratory distress.  Genitourinary:  Genitourinary Comments: GYN:  External genitalia within normal limits.  Vaginal mucosa pink, moist, normal rugae.  Nonfriable cervix without lesions, no discharge or bleeding noted on speculum exam.  IUD strings visible from external cervical loss  Musculoskeletal: She exhibits no edema.  Neurological: She is alert and oriented to person, place, and time.  Skin: Skin is warm and dry. Capillary refill takes less than 2 seconds. No rash noted.  Psychiatric: Her speech is normal  and behavior is normal. Judgment and thought content normal. Her mood appears not anxious. Cognition and memory are normal. She exhibits a depressed mood.  Vitals reviewed.      Assessment & Plan:  1. Encounter for routine checking of intrauterine contraceptive device (IUD) -String check completed today - Strings are visible and in appropriate position - Patient can continue her low-dose Sprintec for another few months to regulate irregular bleeding that can occur initially with  Mirena -Discussed return precautions  2. Chlamydia infection -Patient is asymptomatic and status post treatment with azithromycin - Her sexual partner and his other sexual partner were both treated for chlamydia as well -She should have another test completed as she is in a high risk situation for reinfection in about 3 months   Return in about 3 months (around 06/27/2018) for GC/CT retesting.   The entirety of the information documented in the History of Present Illness, Review of Systems and Physical Exam were personally obtained by me. Portions of this information were initially documented by Irving Burton Ratchford, CMA and reviewed by me for thoroughness and accuracy.    Erasmo Downer, MD, MPH [facility] 03/28/2018 9:59 AM

## 2018-07-03 ENCOUNTER — Ambulatory Visit: Payer: Self-pay | Admitting: Physician Assistant

## 2018-07-06 ENCOUNTER — Ambulatory Visit: Payer: BLUE CROSS/BLUE SHIELD | Admitting: Physician Assistant

## 2018-07-06 ENCOUNTER — Encounter: Payer: Self-pay | Admitting: Physician Assistant

## 2018-07-06 ENCOUNTER — Other Ambulatory Visit (HOSPITAL_COMMUNITY)
Admission: RE | Admit: 2018-07-06 | Discharge: 2018-07-06 | Disposition: A | Discharge status: Home / Self Care | Payer: BLUE CROSS/BLUE SHIELD | Source: Ambulatory Visit | Attending: Physician Assistant | Admitting: Physician Assistant

## 2018-07-06 VITALS — BP 110/64 | HR 90 | Resp 16 | Wt 196.0 lb

## 2018-07-06 DIAGNOSIS — Z8619 Personal history of other infectious and parasitic diseases: Secondary | ICD-10-CM | POA: Insufficient documentation

## 2018-07-06 DIAGNOSIS — Z113 Encounter for screening for infections with a predominantly sexual mode of transmission: Secondary | ICD-10-CM | POA: Insufficient documentation

## 2018-07-06 NOTE — Progress Notes (Signed)
       Patient: Savannah Callahan Female    DOB: 05/27/1991   27 y.o.   MRN: [id number] Visit Date: 07/11/2018  Today's Provider: Trey SailorsAdriana M Pollak, PA-C   CC: STI screening  Subjective:    HPI   Previously tested positive for chlamydia. Was treated with azithyromcyin and completed treatment without issue. Here today for follow up screening. [facility] therapist zoloft 100 mg QD and  buspar 10 mg tid. Denies any fevers, chills, N/V, pelvic pain or vaginal discharge.    No Known Allergies   Current Outpatient Medications:  .  busPIRone (BUSPAR) 10 MG tablet, Take 10 mg by mouth 3 (three) times daily., Disp: , Rfl:  .  levonorgestrel (MIRENA) 20 MCG/24HR IUD, 1 each by Intrauterine route once., Disp: , Rfl:  .  Multiple Vitamin (MULTIVITAMIN) tablet, Take 1 tablet by mouth daily., Disp: , Rfl:  .  sertraline (ZOLOFT) 100 MG tablet, Take 100 mg by mouth daily., Disp: , Rfl:  .  tretinoin (RETIN-A) 0.05 % cream, APPLY EXTERNALLY TO THE AFFECTED AREA AT BEDTIME, Disp: 45 g, Rfl: 0 .  valACYclovir (VALTREX) 1000 MG tablet, Take 1 tablet (1,000 mg total) by mouth 2 (two) times daily., Disp: 30 tablet, Rfl: 2  Review of Systems  Constitutional: Negative for appetite change, chills, fatigue and fever.  Respiratory: Negative for chest tightness and shortness of breath.   Cardiovascular: Negative for chest pain and palpitations.  Gastrointestinal: Negative for abdominal pain, nausea and vomiting.  Neurological: Negative for dizziness and weakness.    Social History   Tobacco Use  . Smoking status: Never Smoker  . Smokeless tobacco: Never Used  Substance Use Topics  . Alcohol use: Yes    Comment: rarely   Objective:   BP 110/64 (BP Location: Right Arm, Patient Position: Sitting, Cuff Size: Large)   Pulse 90   Resp 16   Wt 196 lb (88.9 kg)   SpO2 98%   BMI 32.62 kg/m  Vitals:   07/06/18 1155  BP: 110/64  Pulse: 90  Resp: 16  SpO2: 98%  Weight:  196 lb (88.9 kg)     Physical Exam  Constitutional: She is oriented to person, place, and time. She appears well-developed and well-nourished.  Cardiovascular: Normal rate and regular rhythm.  Neurological: She is alert and oriented to person, place, and time.  Skin: Skin is warm and dry.  Psychiatric: She has a normal mood and affect. Her behavior is normal.        Assessment & Plan:     1. History of chlamydia  - Urine cytology ancillary only  2. Routine screening for STI (sexually transmitted infection)  - Urine cytology ancillary only  Return if symptoms worsen or fail to improve.  The entirety of the information documented in the History of Present Illness, Review of Systems and Physical Exam were personally obtained by me. Portions of this information were initially documented by Kavin LeechLaura Walsh, CMA and reviewed by me for thoroughness and accuracy.         Trey SailorsAdriana M Pollak, PA-C  [facility]

## 2018-07-06 NOTE — Patient Instructions (Signed)
Chlamydia, Female Chlamydia is an STD (sexually transmitted disease). This is an infection that spreads through sexual contact. If it is not treated, it can cause serious problems. It must be treated with antibiotic medicine. Sometimes, you may not have symptoms (asymptomatic). When you have symptoms, they can include:  Burning when you pee (urinate).  Peeing often.  Fluid (discharge) coming from the vagina.  Redness, soreness, and swelling (inflammation) of the butt (rectum).  Bleeding or fluid coming from the butt.  Belly (abdominal) pain.  Pain during sex.  Bleeding between periods.  Itching, burning, or redness in the eyes.  Fluid coming from the eyes.  Follow these instructions at home: Medicines  Take over-the-counter and prescription medicines only as told by your doctor.  Take your antibiotic medicine as told by your doctor. Do not stop taking the antibiotic even if you start to feel better. Sexual activity  Tell sex partners about your infection. Sex partners are people you had oral, anal, or vaginal sex with within 60 days of when you started getting sick. They need treatment, too.  Do not have sex until: ? You and your sex partners have been treated. ? Your doctor says it is okay.  If you have a single dose treatment, wait 7 days before having sex. General instructions  It is up to you to get your test results. Ask your doctor when your results will be ready.  Get a lot of rest.  Eat healthy foods.  Drink enough fluid to keep your pee (urine) clear or pale yellow.  Keep all follow-up visits as told by your doctor. You may need tests after 3 months. Preventing chlamydia  The only way to prevent chlamydia is not to have sex. To lower your risk: ? Use latex condoms correctly. Do this every time you have sex. ? Avoid having many sex partners. ? Ask if your partner has been tested for STDs and if he or she had negative results. Contact a doctor if:  You  get new symptoms.  You do not get better with treatment.  You have a fever or chills.  You have pain during sex. Get help right away if:  Your pain gets worse and does not get better with medicine.  You get flu-like symptoms, such as: ? Night sweats. ? Sore throat. ? Muscle aches.  You feel sick to your stomach (nauseous).  You throw up (vomit).  You have trouble swallowing.  You have bleeding: ? Between periods. ? After sex.  You have irregular periods.  You have belly pain that does not get better with medicine.  You have lower back pain that does not get better with medicine.  You feel weak or dizzy.  You pass out (faint).  You are pregnant and you get symptoms of chlamydia. Summary  Chlamydia is an infection that spreads through sexual contact.  Sometimes, chlamydia can cause no symptoms (asymptomatic).  Do not have sex until your doctor says it is okay.  All sex partners will have to be treated for chlamydia. This information is not intended to replace advice given to you by your health care provider. Make sure you discuss any questions you have with your health care provider. Document Released: 08/16/2008 Document Revised: 10/27/2016 Document Reviewed: 10/27/2016 Elsevier Interactive Patient Education  2017 Elsevier Inc.  

## 2018-07-09 ENCOUNTER — Telehealth: Payer: Self-pay

## 2018-07-09 LAB — URINE CYTOLOGY ANCILLARY ONLY
Chlamydia: NEGATIVE
Neisseria Gonorrhea: NEGATIVE
Trichomonas: NEGATIVE

## 2018-07-09 NOTE — Telephone Encounter (Signed)
-----   Message from Trey SailorsAdriana M Pollak, [place]PA-C sent at 07/09/2018  4:57 PM EDT ----- STI screening all normal.

## 2018-07-09 NOTE — Telephone Encounter (Signed)
LMTCB 07/09/2018  Thanks,   -Laura  

## 2018-08-26 ENCOUNTER — Encounter: Payer: Self-pay | Admitting: Family Medicine

## 2018-08-31 LAB — HIV ANTIBODY (ROUTINE TESTING W REFLEX): HIV: NONREACTIVE

## 2018-10-29 ENCOUNTER — Encounter: Payer: Self-pay | Admitting: Physician Assistant

## 2019-01-01 ENCOUNTER — Ambulatory Visit: Payer: Self-pay | Admitting: Physician Assistant

## 2019-01-01 ENCOUNTER — Encounter: Payer: Self-pay | Admitting: Physician Assistant

## 2019-01-29 ENCOUNTER — Encounter: Payer: Self-pay | Admitting: Physician Assistant

## 2019-02-09 ENCOUNTER — Encounter: Payer: Self-pay | Admitting: Physician Assistant

## 2019-03-06 ENCOUNTER — Encounter: Payer: Self-pay | Admitting: Physician Assistant

## 2019-03-20 ENCOUNTER — Ambulatory Visit: Payer: Self-pay | Admitting: Physician Assistant

## 2019-05-06 ENCOUNTER — Ambulatory Visit (INDEPENDENT_AMBULATORY_CARE_PROVIDER_SITE_OTHER): Payer: BC Managed Care – PPO | Admitting: Physician Assistant

## 2019-05-06 ENCOUNTER — Other Ambulatory Visit: Payer: Self-pay

## 2019-05-06 ENCOUNTER — Encounter: Payer: Self-pay | Admitting: Physician Assistant

## 2019-05-06 VITALS — BP 118/78 | HR 74 | Temp 98.1°F | Ht 65.0 in | Wt 217.4 lb

## 2019-05-06 DIAGNOSIS — Z0000 Encounter for general adult medical examination without abnormal findings: Secondary | ICD-10-CM

## 2019-05-06 DIAGNOSIS — N939 Abnormal uterine and vaginal bleeding, unspecified: Secondary | ICD-10-CM

## 2019-05-06 DIAGNOSIS — F332 Major depressive disorder, recurrent severe without psychotic features: Secondary | ICD-10-CM | POA: Diagnosis not present

## 2019-05-06 DIAGNOSIS — R21 Rash and other nonspecific skin eruption: Secondary | ICD-10-CM

## 2019-05-06 MED ORDER — NORGESTIMATE-ETH ESTRADIOL 0.25-35 MG-MCG PO TABS: 1.0000 | ORAL_TABLET | Freq: Every day | ORAL | 0 refills | Status: DC

## 2019-05-06 MED ORDER — CLOTRIMAZOLE-BETAMETHASONE 1-0.05 % EX CREA: 1.0000 "application " | TOPICAL_CREAM | Freq: Two times a day (BID) | CUTANEOUS | 0 refills | Status: DC

## 2019-05-06 NOTE — Progress Notes (Signed)
Patient: Savannah Callahan, Female    DOB: 1991-09-04, 28 y.o.   MRN: [id number] Visit Date: 05/07/2019  Today's Provider: Trinna Post, PA-C   Chief Complaint  Patient presents with  . Annual Exam   Subjective:    Annual physical exam Savannah Callahan is a 28 y.o. female who presents today for health maintenance and complete physical. She feels well. She reports exercising none. She reports she is sleeping well.  Last PAP smear 2018 with [facility], reports it was normal. Records not available today.  Last tetanus shot 12/2018.  Had IUD inserted 02/2018. Vaginitis swab positive for chlamydia at the time which was treated. Has recently undergone separation from husband. Reports she had new sexual partner and was treated by the health department for chlamydia and BV. Did not have test of cure. Since then, though, she denies symptoms.   She does however report that she is having vaginal bleeding with sexual activity that she did not have prior to the IUD.   Also reports that she had a rash on her abdomen that was treated with lotrisone and improved but is starting to come back.  She was previously on zoloft for depression, which she has discontinued and does not feel she needs at this point. -----------------------------------------------------------------  Review of Systems  Constitutional: Negative.   HENT: Negative.   Eyes: Negative.   Respiratory: Negative.   Cardiovascular: Negative.   Gastrointestinal: Negative.   Endocrine: Negative.   Genitourinary: Positive for vaginal discharge.  Musculoskeletal: Negative.   Skin: Negative.   Allergic/Immunologic: Negative.   Neurological: Negative.   Hematological: Negative.   Psychiatric/Behavioral: Negative.     Social History She  reports that she has never smoked. She has never used smokeless tobacco. She reports current alcohol use. She reports that she does not use drugs. Social History   Socioeconomic History   . Marital status: Married    Spouse name: Not on file  . Number of children: 1  . Years of education: Not on file  . Highest education level: Not on file  Occupational History  . Not on file  Social Needs  . Financial resource strain: Not on file  . Food insecurity    Worry: Not on file    Inability: Not on file  . Transportation needs    Medical: Not on file    Non-medical: Not on file  Tobacco Use  . Smoking status: Never Smoker  . Smokeless tobacco: Never Used  Substance and Sexual Activity  . Alcohol use: Yes    Comment: rarely  . Drug use: No  . Sexual activity: Yes    Birth control/protection: Pill    Comment: sex last on 12/20/16  Lifestyle  . Physical activity    Days per week: Not on file    Minutes per session: Not on file  . Stress: Not on file  Relationships  . Social Herbalist on phone: Not on file    Gets together: Not on file    Attends religious service: Not on file    Active member of club or organization: Not on file    Attends meetings of clubs or organizations: Not on file    Relationship status: Not on file  Other Topics Concern  . Not on file  Social History Narrative  . Not on file    Patient Active Problem List   Diagnosis Date Noted  . External hemorrhoid 06/01/2017  . Depression   .  Major depression 09/07/2015  . Passive suicidal ideations 09/07/2015    Past Surgical History:  Procedure Laterality Date  . CESAREAN SECTION N/A 12/22/2016   Procedure: CESAREAN SECTION;  Surgeon: Gae Dry, MD;  Location: [facility] ORS;  Service: Obstetrics;  Laterality: N/A;    Family History  Family Status  Relation Name Status  . Mother  Alive  . Father  Alive  . Brother  Alive  . MGM  Alive  . MGF  Alive  . PGM  Alive  . PGF  Deceased       MVA   Her family history includes Stroke in her maternal grandmother.     No Known Allergies  Previous Medications   LEVONORGESTREL (MIRENA) 20 MCG/24HR IUD    1 each by Intrauterine  route once.   MULTIPLE VITAMIN (MULTIVITAMIN) TABLET    Take 1 tablet by mouth daily.    Patient Care Team: Paulene Floor as PCP - General (Physician Assistant)      Objective:   Vitals: BP 118/78 (BP Location: Left Arm, Patient Position: Sitting, Cuff Size: Normal)   Pulse 74   Temp 98.1 F (36.7 C) (Oral)   Ht _0  (1.651 m)   Wt 217 lb 6.4 oz (98.6 kg)   SpO2 99%   BMI 36.18 kg/m    Physical Exam Constitutional:      Appearance: Normal appearance.  HENT:     Right Ear: Tympanic membrane and ear canal normal.     Left Ear: Tympanic membrane and ear canal normal.  Cardiovascular:     Rate and Rhythm: Normal rate and regular rhythm.     Heart sounds: Normal heart sounds.  Pulmonary:     Effort: Pulmonary effort is normal.     Breath sounds: Normal breath sounds.  Chest:     Breasts:        Right: Normal.        Left: Normal.  Abdominal:     General: Abdomen is flat.     Palpations: Abdomen is soft.  Skin:    General: Skin is warm and dry.     Findings: Rash present.     Comments: Small hyperpigmented macules along torso  Neurological:     Mental Status: She is alert and oriented to person, place, and time. Mental status is at baseline.  Psychiatric:        Mood and Affect: Mood normal.        Behavior: Behavior normal.      Depression Screen PHQ 2/9 Scores 05/06/2019  PHQ - 2 Score 1  PHQ- 9 Score 1      Assessment & Plan:     Routine Health Maintenance and Physical Exam  Exercise Activities and Dietary recommendations Goals   None     Immunization History  Administered Date(s) Administered  . DTaP 05/26/1989, 08/30/1989, 12/05/1989, 09/21/1990, 03/11/1994  . Hepatitis B 01/15/1994, 03/11/1994, 09/09/1994  . HiB (PRP-OMP) 01/05/1990, 03/14/1990, 09/21/1990  . Hpv 06/30/2008, 09/12/2008, 02/19/2009  . IPV 08/30/1989, 12/05/1989, 09/21/1990, 02/19/1994  . Influenza Inj Mdck Quad Pf 08/08/2018  . MMR 07/03/1990, 03/11/1994  .  Meningococcal Conjugate 05/09/2007  . Td 06/24/2004  . Tdap 01/16/2019    Health Maintenance  Topic Date Due  . INFLUENZA VACCINE  06/22/2019  . PAP SMEAR-Modifier  02/08/2020  . TETANUS/TDAP  01/16/2029  . HIV Screening  Completed     Discussed health benefits of physical activity, and encouraged her to engage in regular exercise  appropriate for her age and condition.    1. Annual physical exam   2. Skin rash  Healing - this would be an unusual location and distribution for ringworm. Perhaps atypical psoriasis. However, cream below should cover each issue.   - clotrimazole-betamethasone (LOTRISONE) cream; Apply 1 application topically 2 (two) times daily.  Dispense: 30 g; Refill: 0  3. Vaginal bleeding  Will add OCP to help regulate. She will keep IUD. Would like to repeat STI screen but last time test was very expensive to patient. She will go to health department to get retested.  - norgestimate-ethinyl estradiol (SPRINTEC 28) 0.25-35 MG-MCG tablet; Take 1 tablet by mouth daily.  Dispense: 112 Package; Refill: 0  4. Major Depression  Not currently taking medication. Does not feel she needs it at this point.  The entirety of the information documented in the History of Present Illness, Review of Systems and Physical Exam were personally obtained by me. Portions of this information were initially documented by [facility], CMA and reviewed by me for thoroughness and accuracy.   F/u 1 year CPE  --------------------------------------------------------------------

## 2019-05-06 NOTE — Patient Instructions (Signed)

## 2019-07-31 ENCOUNTER — Other Ambulatory Visit: Payer: Self-pay | Admitting: Physician Assistant

## 2019-07-31 DIAGNOSIS — N939 Abnormal uterine and vaginal bleeding, unspecified: Secondary | ICD-10-CM

## 2019-08-01 ENCOUNTER — Other Ambulatory Visit: Payer: Self-pay | Admitting: Physician Assistant

## 2019-08-01 DIAGNOSIS — N939 Abnormal uterine and vaginal bleeding, unspecified: Secondary | ICD-10-CM

## 2019-08-01 MED ORDER — NORGESTIMATE-ETH ESTRADIOL 0.25-35 MG-MCG PO TABS: 1.0000 | ORAL_TABLET | Freq: Every day | ORAL | 0 refills | Status: DC

## 2019-11-05 ENCOUNTER — Ambulatory Visit: Payer: Self-pay | Admitting: Family Medicine

## 2019-11-05 ENCOUNTER — Encounter: Payer: Self-pay | Admitting: Family Medicine

## 2019-11-05 ENCOUNTER — Other Ambulatory Visit: Payer: Self-pay

## 2019-11-05 DIAGNOSIS — Z113 Encounter for screening for infections with a predominantly sexual mode of transmission: Secondary | ICD-10-CM

## 2019-11-05 DIAGNOSIS — Z8619 Personal history of other infectious and parasitic diseases: Secondary | ICD-10-CM

## 2019-11-05 LAB — WET PREP FOR TRICH, YEAST, CLUE
Trichomonas Exam: NEGATIVE
Yeast Exam: NEGATIVE

## 2019-11-05 NOTE — Progress Notes (Signed)
In for STD screening for TOC of Chlamydia-treated ~ 12/2018;declines HIV/RPR testing Debera Lat, RN Has Mirena IUD Debera Lat, RN

## 2019-11-05 NOTE — Progress Notes (Signed)
  [facility] Department STI clinic/screening visit  Subjective:  Savannah Callahan is a 28 y.o. female being seen today for  Chief Complaint  Patient presents with  . SEXUALLY TRANSMITTED DISEASE  .    Patient has the following medical conditions:   Patient Active Problem List   Diagnosis Date Noted  . External hemorrhoid 06/01/2017  . Depression   . Major depression 09/07/2015  . Passive suicidal ideations 09/07/2015    Pt is here for rescreen of chlamydia. Dx and treated for this 12/2018. She states she has had discharge 1 wk ago, though now resolving. Denies other symptoms.    See flowsheet for further details and programmatic requirements.   No LMP recorded. (Menstrual status: IUD). Last sex: 12/14 BCM: Depo ECP: n/a  Patient reports that they do not desire a pregnancy in the next year. They reported they are not interested in discussing contraception today.   No components found for: HCV]   The following portions of the patient's history were reviewed and updated as appropriate: allergies, current medications, past medical history, past social history, past surgical history and problem list.  Objective:  There were no vitals filed for this visit.   Physical Exam Gen: well appearing, NAD HEENT: no scleral icterus CV: RR Lung: Normal WOB Ext: warm well perfused, no edema  Pt declines pelvic exam, elects to self swab.   Assessment and Plan:  Savannah Callahan is a 28 y.o. female presenting to the [facility] Department for STI screening    1. History of chlamydia Treatment completed 12/2018. Rescreen today.  2. Screening examination for venereal disease -Screenings today as below. Treat wet prep per standing order -Patient does not meet criteria for HepB, HepC Screening. Declines hiv and syphilis screenings. -Counseled on warning s/sx and when to seek care. Recommended condom use with all sex. - WET PREP FOR [place], YEAST, [place] [facility]    Return for screening as needed.  No future appointments.  Kandee Keen, PA-C

## 2020-01-29 ENCOUNTER — Other Ambulatory Visit: Payer: Self-pay | Admitting: Physician Assistant

## 2020-01-29 DIAGNOSIS — N939 Abnormal uterine and vaginal bleeding, unspecified: Secondary | ICD-10-CM

## 2020-02-07 ENCOUNTER — Encounter: Payer: Self-pay | Admitting: Physician Assistant

## 2020-02-07 DIAGNOSIS — Z8619 Personal history of other infectious and parasitic diseases: Secondary | ICD-10-CM

## 2020-02-07 MED ORDER — VALACYCLOVIR HCL 1 G PO TABS: 1000.0000 mg | ORAL_TABLET | Freq: Two times a day (BID) | ORAL | 1 refills | Status: DC

## 2020-04-27 ENCOUNTER — Other Ambulatory Visit: Payer: Self-pay | Admitting: Physician Assistant

## 2020-04-27 DIAGNOSIS — N939 Abnormal uterine and vaginal bleeding, unspecified: Secondary | ICD-10-CM

## 2020-04-27 NOTE — Telephone Encounter (Signed)
Requested Prescriptions  Pending Prescriptions Disp Refills  . MONO-LINYAH 0.25-35 MG-MCG tablet [Pharmacy Med Name: MONO-LINYAH 28 TAB[!]] 84 tablet 0    Sig: TAKE ONE TABLET BY MOUTH ONE TIME DAILY     OB/GYN:  Contraceptives Passed - 04/27/2020 12:20 AM      Passed - Last BP in normal range    BP Readings from Last 1 Encounters:  05/06/19 118/78         Passed - Valid encounter within last 12 months    Recent Outpatient Visits          11 months ago Annual physical exam   [facility] [place], [name], [place]   1 year ago History of chlamydia   [facility] [place], [name], [place]   2 years ago Encounter for routine checking of intrauterine contraceptive device (IUD)   [facility], Marzella Schlein, [place]   2 years ago Encounter for surveillance of contraceptives, unspecified contraceptive   [facility] [place], [name], [place]   2 years ago Rhus dermatitis   [facility], [place], [place]

## 2020-05-19 ENCOUNTER — Ambulatory Visit: Payer: Self-pay | Admitting: Physician Assistant

## 2020-05-19 ENCOUNTER — Other Ambulatory Visit: Payer: Self-pay

## 2020-05-19 DIAGNOSIS — Z113 Encounter for screening for infections with a predominantly sexual mode of transmission: Secondary | ICD-10-CM

## 2020-05-19 LAB — WET PREP FOR TRICH, YEAST, CLUE
Trichomonas Exam: NEGATIVE
Yeast Exam: NEGATIVE

## 2020-05-19 NOTE — Progress Notes (Signed)
Wet mount reviewed and no treatment needed for wet mount per standing order as pt is not having any symptoms. Provider orders completed. 

## 2020-05-20 ENCOUNTER — Encounter: Payer: Self-pay | Admitting: Physician Assistant

## 2020-05-20 NOTE — Progress Notes (Signed)
  [facility] Department STI clinic/screening visit  Subjective:  Savannah Callahan is a 29 y.o. female being seen today for an STI screening visit. The patient reports they do not have symptoms.  Patient reports that they do not desire a pregnancy in the next year.   They reported they are not interested in discussing contraception today.  No LMP recorded (approximate). (Menstrual status: IUD).   Patient has the following medical conditions:   Patient Active Problem List   Diagnosis Date Noted  . External hemorrhoid 06/01/2017  . Depression   . Major depression 09/07/2015  . Passive suicidal ideations 09/07/2015    Chief Complaint  Patient presents with  . SEXUALLY TRANSMITTED DISEASE    screening    HPI  Patient reports that she is not having any symptoms but would like a screening today.  Reports that she has no chronic conditions, has had a C-section, has a Mirena IUD and takes OCs to help if she has irregular bleeding.  Thinks that her last pap was in 2018.  See flowsheet for further details and programmatic requirements.    The following portions of the patient's history were reviewed and updated as appropriate: allergies, current medications, past medical history, past social history, past surgical history and problem list.  Objective:  There were no vitals filed for this visit.  Physical Exam Constitutional:      General: She is not in acute distress.    Appearance: Normal appearance.  HENT:     Head: Normocephalic and atraumatic.     Comments: No nits, lice or hair loss. Eyes:     Conjunctiva/sclera: Conjunctivae normal.  Pulmonary:     Effort: Pulmonary effort is normal.  Neurological:     Mental Status: She is alert and oriented to person, place, and time.  Psychiatric:        Mood and Affect: Mood normal.        Behavior: Behavior normal.        Thought Content: Thought content normal.        Judgment: Judgment normal.   patient opts to  self-collect vaginal samples for testing.  Assessment and Plan:  Savannah Callahan is a 29 y.o. female presenting to the [facility] Department for STI screening  1. Screening for STD (sexually transmitted disease) Patient into clinic without symptoms.   Counseled patient how to collect vaginal samples to be sent for wet mount and GC/Chlamydia testing. Rec condoms with all sex. Await test results.  Counseled that RN will call if needs to RTC for treatment once results are back.  - WET PREP FOR [facility], YEAST, CLUE - Chlamydia/Gonorrhea [place] [facility] - HIV [place] [facility] - Syphilis Serology, [place] [facility]     Return for 2-3 weeks for TR's, and PRN.  No future appointments.  Matt Holmes, PA

## 2020-07-22 ENCOUNTER — Other Ambulatory Visit: Payer: Self-pay | Admitting: Physician Assistant

## 2020-07-22 DIAGNOSIS — N939 Abnormal uterine and vaginal bleeding, unspecified: Secondary | ICD-10-CM

## 2020-07-22 NOTE — Telephone Encounter (Signed)
Requested  medications are  due for refill today yes  Requested medications are on the active medication list yes  Last refill 6/8  Last visit 04/2019  Future visit scheduled no  Notes to clinic Failed protocol of visit within 12 months

## 2020-07-22 NOTE — Telephone Encounter (Signed)
I refilled for a month but she will need a CPE and/or follow up for more refills as I haven't seen her in a year.

## 2020-07-24 NOTE — Telephone Encounter (Signed)
Patient was advised and scheduled appointment for 08/10/2020 @ 2:00 PM.

## 2020-08-07 NOTE — Progress Notes (Signed)
Complete physical exam   Patient: Savannah Callahan   DOB: December 05, 1990   29 y.o. Female  MRN: [id number] Visit Date: 08/10/2020  Today's healthcare provider: Trinna Post, PA-C   Chief Complaint  Patient presents with  . Annual Exam  I,Savannah Callahan,acting as a scribe for Trinna Post, PA-C.,have documented all relevant documentation on the behalf of Trinna Post, PA-C,as directed by  Trinna Post, PA-C while in the presence of Trinna Post, PA-C.  Subjective    Savannah Callahan is a 29 y.o. female who presents today for a complete physical exam.  She reports consuming a general diet. Gym/ health club routine includes cardio and light weights. She generally feels well. She reports sleeping well. She does not have additional problems to discuss today.  HPI   She has an IUD but remains on continuous OCP in order to regulate dysmenorrhea. She denies history of heart attack, blood clot, and stroke.   She has received both flu and COVID shots.   She is due for PAP smear today.   Past Medical History:  Diagnosis Date  . Depression    Past Surgical History:  Procedure Laterality Date  . CESAREAN SECTION N/A 12/22/2016   Procedure: CESAREAN SECTION;  Surgeon: Gae Dry, MD;  Location: [facility] ORS;  Service: Obstetrics;  Laterality: N/A;   Social History   Socioeconomic History  . Marital status: Divorced    Spouse name: Not on file  . Number of children: 1  . Years of education: Not on file  . Highest education level: Not on file  Occupational History  . Not on file  Tobacco Use  . Smoking status: Never Smoker  . Smokeless tobacco: Never Used  Substance and Sexual Activity  . Alcohol use: Yes    Alcohol/week: 1.0 standard drink    Types: 1 Glasses of wine per week  . Drug use: No  . Sexual activity: Yes    Birth control/protection: Pill, I.U.D.  Other Topics Concern  . Not on file  Social History Narrative  . Not on file   Social  Determinants of Health   Financial Resource Strain:   . Difficulty of Paying Living Expenses: Not on file  Food Insecurity:   . Worried About Charity fundraiser in the Last Year: Not on file  . Ran Out of Food in the Last Year: Not on file  Transportation Needs:   . Lack of Transportation (Medical): Not on file  . Lack of Transportation (Non-Medical): Not on file  Physical Activity:   . Days of Exercise per Week: Not on file  . Minutes of Exercise per Session: Not on file  Stress:   . Feeling of Stress : Not on file  Social Connections:   . Frequency of Communication with Friends and Family: Not on file  . Frequency of Social Gatherings with Friends and Family: Not on file  . Attends Religious Services: Not on file  . Active Member of Clubs or Organizations: Not on file  . Attends Archivist Meetings: Not on file  . Marital Status: Not on file  Intimate Partner Violence:   . Fear of Current or Ex-Partner: Not on file  . Emotionally Abused: Not on file  . Physically Abused: Not on file  . Sexually Abused: Not on file   Family Status  Relation Name Status  . Mother  Alive  . Father  Alive  . Brother  Alive  .  MGM  Alive  . MGF  Alive  . PGM  Alive  . PGF  Deceased       MVA   Family History  Problem Relation Age of Onset  . Stroke Maternal Grandmother    No Known Allergies  Patient Care Team: Paulene Floor as PCP - General (Physician Assistant)   Medications: Outpatient Medications Prior to Visit  Medication Sig  . levonorgestrel (MIRENA) 20 MCG/24HR IUD 1 each by Intrauterine route once.  . Multiple Vitamin (MULTIVITAMIN) tablet Take 1 tablet by mouth daily.  . valACYclovir (VALTREX) 1000 MG tablet Take 1 tablet (1,000 mg total) by mouth 2 (two) times daily.  . [DISCONTINUED] MONO-LINYAH 0.25-35 MG-MCG tablet TAKE ONE TABLET BY MOUTH ONE TIME DAILY  . [DISCONTINUED] phentermine 37.5 MG capsule Take 37.5 mg by mouth every morning.   No  facility-administered medications prior to visit.    Review of Systems  Constitutional: Negative.   HENT: Negative.   Eyes: Negative.   Respiratory: Negative.   Cardiovascular: Negative.   Gastrointestinal: Negative.   Endocrine: Negative.   Genitourinary: Negative.   Musculoskeletal: Negative.   Allergic/Immunologic: Negative.   Neurological: Positive for headaches.  Hematological: Negative.   Psychiatric/Behavioral: Negative.       Objective    BP 117/75 (BP Location: Left Arm, Patient Position: Sitting, Cuff Size: Large)   Pulse 77   Temp 98.6 F (37 C) (Oral)   Ht $R'5\' 5"'Kj$  (1.651 m)   Wt 209 lb 9.6 oz (95.1 kg)   SpO2 100%   BMI 34.88 kg/m    Physical Exam Constitutional:      Appearance: Normal appearance.  HENT:     Right Ear: Tympanic membrane, ear canal and external ear normal.     Left Ear: Tympanic membrane, ear canal and external ear normal.  Cardiovascular:     Rate and Rhythm: Normal rate and regular rhythm.     Pulses: Normal pulses.     Heart sounds: Normal heart sounds.  Pulmonary:     Effort: Pulmonary effort is normal.     Breath sounds: Normal breath sounds.  Abdominal:     General: Abdomen is flat. Bowel sounds are normal.     Palpations: Abdomen is soft.  Genitourinary:    Vagina: Normal.     Cervix: Normal.     Comments: IUD strings visualized protruding perpendicular from the os and are not wrapped around the cervix.  Skin:    General: Skin is warm and dry.  Neurological:     General: No focal deficit present.     Mental Status: She is alert and oriented to person, place, and time.  Psychiatric:        Mood and Affect: Mood normal.        Behavior: Behavior normal.       Last depression screening scores PHQ 2/9 Scores 08/10/2020 05/06/2019  PHQ - 2 Score 0 1  PHQ- 9 Score 1 1   Last fall risk screening Fall Risk  08/10/2020  Falls in the past year? 0  Number falls in past yr: 0  Injury with Fall? 0  Risk for fall due to : No  Fall Risks  Follow up Falls evaluation completed   Last Audit-C alcohol use screening Alcohol Use Disorder Test (AUDIT) 08/10/2020  1. How often do you have a drink containing alcohol? 2  2. How many drinks containing alcohol do you have on a typical day when you are drinking? 1  3. How often do you have six or more drinks on one occasion? 0  AUDIT-C Score 3  4. How often during the last year have you found that you were not able to stop drinking once you had started? 0  5. How often during the last year have you failed to do what was normally expected from you because of drinking? 0  6. How often during the last year have you needed a first drink in the morning to get yourself going after a heavy drinking session? 0  7. How often during the last year have you had a feeling of guilt of remorse after drinking? 0  8. How often during the last year have you been unable to remember what happened the night before because you had been drinking? 0  9. Have you or someone else been injured as a result of your drinking? 0  10. Has a relative or friend or a doctor or another health worker been concerned about your drinking or suggested you cut down? 0  Alcohol Use Disorder Identification Test Final Score (AUDIT) 3   A score of 3 or more in women, and 4 or more in men indicates increased risk for alcohol abuse, EXCEPT if all of the points are from question 1   No results found for any visits on 08/10/20.  Assessment & Plan    Routine Health Maintenance and Physical Exam  Exercise Activities and Dietary recommendations Goals   None     Immunization History  Administered Date(s) Administered  . DTaP 05/26/1989, 08/30/1989, 12/05/1989, 09/21/1990, 03/11/1994  . Hepatitis B 01/15/1994, 03/11/1994, 09/09/1994  . HiB (PRP-OMP) 01/05/1990, 03/14/1990, 09/21/1990  . Hpv 06/30/2008, 09/12/2008, 02/19/2009  . IPV 08/30/1989, 12/05/1989, 09/21/1990, 02/19/1994  . Influenza Inj Mdck Quad Pf 08/08/2018   . MMR 07/03/1990, 03/11/1994  . Meningococcal Conjugate 05/09/2007  . Td 06/24/2004  . Tdap 01/16/2019    Health Maintenance  Topic Date Due  . Hepatitis C Screening  Never done  . PAP SMEAR-Modifier  02/08/2020  . COVID-19 Vaccine (1) 08/26/2020 (Originally 03/22/2001)  . INFLUENZA VACCINE  02/18/2021 (Originally 06/21/2020)  . TETANUS/TDAP  01/16/2029  . HIV Screening  Completed    Discussed health benefits of physical activity, and encouraged her to engage in regular exercise appropriate for her age and condition.   1. Annual physical exam  - Cytology - PAP - Hepatitis C antibody - TSH - Lipid panel - Comprehensive metabolic panel - CBC with Differential/Platelet  2. Vaginal bleeding  - norgestimate-ethinyl estradiol (MONO-LINYAH) 0.25-35 MG-MCG tablet; Take 1 tablet by mouth daily.  Dispense: 112 tablet; Refill: 3  3. Severe episode of recurrent major depressive disorder, without psychotic features ([place])  Reports mood is currently stable.    No follow-ups on file.     ITrinna Post, PA-C, have reviewed all documentation for this visit. The documentation on 08/10/20 for the exam, diagnosis, procedures, and orders are all accurate and complete.  The entirety of the information documented in the History of Present Illness, Review of Systems and Physical Exam were personally obtained by me. Portions of this information were initially documented by [facility] and reviewed by me for thoroughness and accuracy.     Paulene Floor  [facility] [phone] (phone) [phone] (fax)  [place]

## 2020-08-10 ENCOUNTER — Other Ambulatory Visit: Payer: Self-pay

## 2020-08-10 ENCOUNTER — Ambulatory Visit (INDEPENDENT_AMBULATORY_CARE_PROVIDER_SITE_OTHER): Payer: BC Managed Care – PPO | Admitting: Physician Assistant

## 2020-08-10 ENCOUNTER — Encounter: Payer: Self-pay | Admitting: Physician Assistant

## 2020-08-10 ENCOUNTER — Other Ambulatory Visit (HOSPITAL_COMMUNITY)
Admission: RE | Admit: 2020-08-10 | Discharge: 2020-08-10 | Disposition: A | Discharge status: Home / Self Care | Payer: BC Managed Care – PPO | Source: Ambulatory Visit | Attending: Physician Assistant | Admitting: Physician Assistant

## 2020-08-10 VITALS — BP 117/75 | HR 77 | Temp 98.6°F | Ht 65.0 in | Wt 209.6 lb

## 2020-08-10 DIAGNOSIS — Z0000 Encounter for general adult medical examination without abnormal findings: Secondary | ICD-10-CM | POA: Diagnosis not present

## 2020-08-10 DIAGNOSIS — N939 Abnormal uterine and vaginal bleeding, unspecified: Secondary | ICD-10-CM

## 2020-08-10 DIAGNOSIS — F332 Major depressive disorder, recurrent severe without psychotic features: Secondary | ICD-10-CM | POA: Diagnosis not present

## 2020-08-10 MED ORDER — NORGESTIMATE-ETH ESTRADIOL 0.25-35 MG-MCG PO TABS: 1.0000 | ORAL_TABLET | Freq: Every day | ORAL | 3 refills | Status: DC

## 2020-08-10 NOTE — Patient Instructions (Signed)
Health Maintenance, Female Adopting a healthy lifestyle and getting preventive care are important in promoting health and wellness. Ask your health care provider about:  The right schedule for you to have regular tests and exams.  Things you can do on your own to prevent diseases and keep yourself healthy. What should I know about diet, weight, and exercise? Eat a healthy diet   Eat a diet that includes plenty of vegetables, fruits, low-fat dairy products, and lean protein.  Do not eat a lot of foods that are high in solid fats, added sugars, or sodium. Maintain a healthy weight Body mass index (BMI) is used to identify weight problems. It estimates body fat based on height and weight. Your health care provider can help determine your BMI and help you achieve or maintain a healthy weight. Get regular exercise Get regular exercise. This is one of the most important things you can do for your health. Most adults should:  Exercise for at least 150 minutes each week. The exercise should increase your heart rate and make you sweat (moderate-intensity exercise).  Do strengthening exercises at least twice a week. This is in addition to the moderate-intensity exercise.  Spend less time sitting. Even light physical activity can be beneficial. Watch cholesterol and blood lipids Have your blood tested for lipids and cholesterol at 31 years of age, then have this test every 5 years. Have your cholesterol levels checked more often if:  Your lipid or cholesterol levels are high.  You are older than 31 years of age.  You are at high risk for heart disease. What should I know about cancer screening? Depending on your health history and family history, you may need to have cancer screening at various ages. This may include screening for:  Breast cancer.  Cervical cancer.  Colorectal cancer.  Skin cancer.  Lung cancer. What should I know about heart disease, diabetes, and high blood  pressure? Blood pressure and heart disease  High blood pressure causes heart disease and increases the risk of stroke. This is more likely to develop in people who have high blood pressure readings, are of African descent, or are overweight.  Have your blood pressure checked: ? Every 3-5 years if you are 18-39 years of age. ? Every year if you are 40 years old or older. Diabetes Have regular diabetes screenings. This checks your fasting blood sugar level. Have the screening done:  Once every three years after age 40 if you are at a normal weight and have a low risk for diabetes.  More often and at a younger age if you are overweight or have a high risk for diabetes. What should I know about preventing infection? Hepatitis B If you have a higher risk for hepatitis B, you should be screened for this virus. Talk with your health care provider to find out if you are at risk for hepatitis B infection. Hepatitis C Testing is recommended for:  Everyone born from 1945 through 1965.  Anyone with known risk factors for hepatitis C. Sexually transmitted infections (STIs)  Get screened for STIs, including gonorrhea and chlamydia, if: ? You are sexually active and are younger than 31 years of age. ? You are older than 31 years of age and your health care provider tells you that you are at risk for this type of infection. ? Your sexual activity has changed since you were last screened, and you are at increased risk for chlamydia or gonorrhea. Ask your health care provider if   you are at risk.  Ask your health care provider about whether you are at high risk for HIV. Your health care provider may recommend a prescription medicine to help prevent HIV infection. If you choose to take medicine to prevent HIV, you should first get tested for HIV. You should then be tested every 3 months for as long as you are taking the medicine. Pregnancy  If you are about to stop having your period (premenopausal) and  you may become pregnant, seek counseling before you get pregnant.  Take 400 to 800 micrograms (mcg) of folic acid every day if you become pregnant.  Ask for birth control (contraception) if you want to prevent pregnancy. Osteoporosis and menopause Osteoporosis is a disease in which the bones lose minerals and strength with aging. This can result in bone fractures. If you are 65 years old or older, or if you are at risk for osteoporosis and fractures, ask your health care provider if you should:  Be screened for bone loss.  Take a calcium or vitamin D supplement to lower your risk of fractures.  Be given hormone replacement therapy (HRT) to treat symptoms of menopause. Follow these instructions at home: Lifestyle  Do not use any products that contain nicotine or tobacco, such as cigarettes, e-cigarettes, and chewing tobacco. If you need help quitting, ask your health care provider.  Do not use street drugs.  Do not share needles.  Ask your health care provider for help if you need support or information about quitting drugs. Alcohol use  Do not drink alcohol if: ? Your health care provider tells you not to drink. ? You are pregnant, may be pregnant, or are planning to become pregnant.  If you drink alcohol: ? Limit how much you use to 0-1 drink a day. ? Limit intake if you are breastfeeding.  Be aware of how much alcohol is in your drink. In the [place] one drink equals one 12 oz bottle of beer (355 mL), one 5 oz glass of wine (148 mL), or one 1 oz glass of hard liquor (44 mL). General instructions  Schedule regular health, dental, and eye exams.  Stay current with your vaccines.  Tell your health care provider if: ? You often feel depressed. ? You have ever been abused or do not feel safe at home. Summary  Adopting a healthy lifestyle and getting preventive care are important in promoting health and wellness.  Follow your health care provider's instructions about healthy  diet, exercising, and getting tested or screened for diseases.  Follow your health care provider's instructions on monitoring your cholesterol and blood pressure. This information is not intended to replace advice given to you by your health care provider. Make sure you discuss any questions you have with your health care provider. Document Revised: 10/31/2018 Document Reviewed: 10/31/2018 Elsevier Patient Education  2020 [employer]vier Inc.  

## 2020-08-13 LAB — CYTOLOGY - PAP
Comment: NEGATIVE
Diagnosis: NEGATIVE
High risk HPV: POSITIVE — AB

## 2021-09-07 ENCOUNTER — Ambulatory Visit (INDEPENDENT_AMBULATORY_CARE_PROVIDER_SITE_OTHER): Payer: BC Managed Care – PPO | Admitting: Physician Assistant

## 2021-09-07 ENCOUNTER — Encounter: Payer: Self-pay | Admitting: Physician Assistant

## 2021-09-07 ENCOUNTER — Other Ambulatory Visit: Payer: Self-pay

## 2021-09-07 ENCOUNTER — Encounter: Payer: BC Managed Care – PPO | Admitting: Family Medicine

## 2021-09-07 VITALS — BP 113/74 | HR 60 | Temp 97.8°F | Resp 16 | Ht 64.0 in | Wt 216.8 lb

## 2021-09-07 DIAGNOSIS — R635 Abnormal weight gain: Secondary | ICD-10-CM | POA: Insufficient documentation

## 2021-09-07 DIAGNOSIS — Z0000 Encounter for general adult medical examination without abnormal findings: Secondary | ICD-10-CM | POA: Diagnosis not present

## 2021-09-07 DIAGNOSIS — Z1151 Encounter for screening for human papillomavirus (HPV): Secondary | ICD-10-CM

## 2021-09-07 DIAGNOSIS — Z124 Encounter for screening for malignant neoplasm of cervix: Secondary | ICD-10-CM | POA: Diagnosis not present

## 2021-09-07 DIAGNOSIS — Z8619 Personal history of other infectious and parasitic diseases: Secondary | ICD-10-CM | POA: Insufficient documentation

## 2021-09-07 MED ORDER — VALACYCLOVIR HCL 1 G PO TABS: 1000.0000 mg | ORAL_TABLET | Freq: Two times a day (BID) | ORAL | 1 refills | Status: DC

## 2021-09-07 NOTE — Assessment & Plan Note (Signed)
Last year HPV + but - for cellular changes. Repeat this year.

## 2021-09-07 NOTE — Patient Instructions (Addendum)
Will f/u on labs and will discuss if mounjaro is appropriate.

## 2021-09-07 NOTE — Assessment & Plan Note (Signed)
Refilled Valtrex, has maybe two flares a year. Stable.

## 2021-09-07 NOTE — Progress Notes (Signed)
Complete physical exam   Patient: Savannah Callahan   DOB: Dec 27, 1990   30 y.o. Female  MRN: [id number] Visit Date: 09/07/2021  Today's healthcare provider: Mikey Kirschner, PA-C   Chief Complaint  Patient presents with   Annual Exam   Subjective     HPI  Savannah Callahan is a 30 y.o. female who presents today for a complete physical exam.   She reports consuming a general diet. Patient reports she is staying active by exercising 3x a week . She generally feels well. She reports sleeping well.  She reports after adding the additional OCP she no longer has breakthrough bleeding and her hormonal acne has improved.  Denies any current anxiety or depression, denies SI. Reports that her past depression was situational and she feels well.   She reports having difficulty losing weight over the past few years and struggles with maintaining a balanced diet. She has heard of Mounjaro and wonders if she is a candidate.  Last Reported Pap-08/10/20 HPV positive Past Medical History:  Diagnosis Date   Depression    Past Surgical History:  Procedure Laterality Date   CESAREAN SECTION N/A 12/22/2016   Procedure: CESAREAN SECTION;  Surgeon: Gae Dry, MD;  Location: [facility] ORS;  Service: Obstetrics;  Laterality: N/A;   Social History   Socioeconomic History   Marital status: Divorced    Spouse name: Not on file   Number of children: 1   Years of education: Not on file   Highest education level: Not on file  Occupational History   Not on file  Tobacco Use   Smoking status: Never   Smokeless tobacco: Never  Substance and Sexual Activity   Alcohol use: Yes    Alcohol/week: 1.0 standard drink    Types: 1 Glasses of wine per week   Drug use: No   Sexual activity: Yes    Birth control/protection: Pill, I.U.D.  Other Topics Concern   Not on file  Social History Narrative   Not on file   Social Determinants of Health   Financial Resource Strain: Not on file  Food  Insecurity: Not on file  Transportation Needs: Not on file  Physical Activity: Not on file  Stress: Not on file  Social Connections: Not on file  Intimate Partner Violence: Not on file   Family Status  Relation Name Status   Mother  Alive   Father  Alive   Brother  Alive   MGM  Alive   MGF  Alive   PGM  Alive   PGF  Deceased       MVA   Family History  Problem Relation Age of Onset   Stroke Maternal Grandmother    No Known Allergies  Patient Care Team: Trinna Post, PA-C (Inactive) as PCP - General (Physician Assistant)   Medications: Outpatient Medications Prior to Visit  Medication Sig   levonorgestrel (MIRENA) 20 MCG/24HR IUD 1 each by Intrauterine route once.   norgestimate-ethinyl estradiol (MONO-LINYAH) 0.25-35 MG-MCG tablet Take 1 tablet by mouth daily.   [DISCONTINUED] valACYclovir (VALTREX) 1000 MG tablet Take 1 tablet (1,000 mg total) by mouth 2 (two) times daily.   [DISCONTINUED] Multiple Vitamin (MULTIVITAMIN) tablet Take 1 tablet by mouth daily.   No facility-administered medications prior to visit.    Review of Systems  Constitutional: Negative.   HENT: Negative.    Eyes: Negative.   Respiratory: Negative.    Cardiovascular: Negative.   Gastrointestinal: Negative.   Endocrine: Negative.  Genitourinary: Negative.   Musculoskeletal: Negative.   Skin: Negative.   Allergic/Immunologic: Negative.   Neurological: Negative.   Hematological: Negative.   Psychiatric/Behavioral: Negative.    All other systems reviewed and are negative.    Objective    BP 113/74   Pulse 60   Temp 97.8 F (36.6 C) (Oral)   Resp 16   Ht $R'5\' 4"'Tc$  (1.626 m)   Wt 216 lb 12.8 oz (98.3 kg)   SpO2 99%   BMI 37.21 kg/m  BASIC VITALS 08/10/2020 05/06/2019  Weight 209 lbs 10 oz 217 lbs 6 oz   Physical Exam Exam conducted with a chaperone present.  Constitutional:      Appearance: Normal appearance. She is normal weight.  HENT:     Head: Normocephalic and atraumatic.      Right Ear: Tympanic membrane, ear canal and external ear normal.     Left Ear: Tympanic membrane, ear canal and external ear normal.     Nose: Nose normal.     Mouth/Throat:     Mouth: Mucous membranes are moist.     Pharynx: Oropharynx is clear.  Eyes:     Extraocular Movements: Extraocular movements intact.     Conjunctiva/sclera: Conjunctivae normal.     Pupils: Pupils are equal, round, and reactive to light.  Cardiovascular:     Rate and Rhythm: Normal rate and regular rhythm.     Pulses: Normal pulses.     Heart sounds: Normal heart sounds.  Pulmonary:     Effort: Pulmonary effort is normal.     Breath sounds: Normal breath sounds.  Abdominal:     General: Abdomen is flat. Bowel sounds are normal.     Palpations: Abdomen is soft.  Genitourinary:    General: Normal vulva.     Exam position: Prone.     Pubic Area: No rash.      Labia:        Right: No rash, tenderness, lesion or injury.        Left: No rash, tenderness, lesion or injury.      Urethra: No prolapse, urethral pain, urethral swelling or urethral lesion.     Comments: Minimal bleeding after pap smear performed. IUD strings visible. Musculoskeletal:        General: Normal range of motion.     Cervical back: Normal range of motion and neck supple.  Skin:    General: Skin is warm and dry.  Neurological:     General: No focal deficit present.     Mental Status: She is alert and oriented to person, place, and time. Mental status is at baseline.  Psychiatric:        Mood and Affect: Mood normal.        Behavior: Behavior normal.        Thought Content: Thought content normal.        Judgment: Judgment normal.     Last depression screening scores PHQ 2/9 Scores 08/10/2020 05/06/2019  PHQ - 2 Score 0 1  PHQ- 9 Score 1 1   Last fall risk screening Fall Risk  08/10/2020  Falls in the past year? 0  Number falls in past yr: 0  Injury with Fall? 0  Risk for fall due to : No Fall Risks  Follow up Falls  evaluation completed   Last Audit-C alcohol use screening Alcohol Use Disorder Test (AUDIT) 08/10/2020  1. How often do you have a drink containing alcohol? 2  2. How many drinks containing  alcohol do you have on a typical day when you are drinking? 1  3. How often do you have six or more drinks on one occasion? 0  AUDIT-C Score 3  4. How often during the last year have you found that you were not able to stop drinking once you had started? 0  5. How often during the last year have you failed to do what was normally expected from you because of drinking? 0  6. How often during the last year have you needed a first drink in the morning to get yourself going after a heavy drinking session? 0  7. How often during the last year have you had a feeling of guilt of remorse after drinking? 0  8. How often during the last year have you been unable to remember what happened the night before because you had been drinking? 0  9. Have you or someone else been injured as a result of your drinking? 0  10. Has a relative or friend or a doctor or another health worker been concerned about your drinking or suggested you cut down? 0  Alcohol Use Disorder Identification Test Final Score (AUDIT) 3   A score of 3 or more in women, and 4 or more in men indicates increased risk for alcohol abuse, EXCEPT if all of the points are from question 1   No results found for any visits on 09/07/21.  Assessment & Plan    Routine Health Maintenance and Physical Exam   Immunization History  Administered Date(s) Administered   DTaP 05/26/1989, 08/30/1989, 12/05/1989, 09/21/1990, 03/11/1994   Hepatitis B 01/15/1994, 03/11/1994, 09/09/1994   HiB (PRP-OMP) 01/05/1990, 03/14/1990, 09/21/1990   Hpv-Unspecified 06/30/2008, 09/12/2008, 02/19/2009   IPV 08/30/1989, 12/05/1989, 09/21/1990, 02/19/1994   Influenza Inj Mdck Quad Pf 08/08/2018, 07/27/2019, 08/31/2021   MMR 07/03/1990, 03/11/1994   Meningococcal Conjugate 05/09/2007    Td 06/24/2004   Tdap 01/16/2019    Health Maintenance  Topic Date Due   COVID-19 Vaccine (1) Never done   Hepatitis C Screening  Never done   PAP SMEAR-Modifier  08/10/2021   TETANUS/TDAP  01/16/2029   INFLUENZA VACCINE  Completed   HPV VACCINES  Completed   HIV Screening  Completed    Discussed health benefits of physical activity, and encouraged her to engage in regular exercise appropriate for her age and condition.  Continue OCP , Mirena can stay for up to 8 years. Problem List Items Addressed This Visit       Other   History of cold sores    Refilled Valtrex, has maybe two flares a year. Stable.      Relevant Medications   valACYclovir (VALTREX) 1000 MG tablet   Screening for cervical cancer   Relevant Orders   Cytology - PAP   History of HPV infection    Last year HPV + but - for cellular changes. Repeat this year.      Weight gain    Discussed Mounjaro for weight loss, encouraged regular exercise and balanced diet. Will check CMP and hgbA1c       Relevant Orders   Comprehensive Metabolic Panel (CMET)   HgB A1c   Other Visit Diagnoses     Encounter for preventive health examination    -  Primary   Encounter for screening for human papillomavirus (HPV)            Return in about 1 year (around 09/07/2022).     I, Mikey Kirschner, PA-C have reviewed all  documentation for this visit. The documentation on 09/07/2021 or the exam, diagnosis, procedures, and orders are all accurate and complete.    Mikey Kirschner, PA-C  [facility] [phone] (phone) [phone] (fax)  [place]

## 2021-09-07 NOTE — Assessment & Plan Note (Signed)
Discussed Mounjaro for weight loss, encouraged regular exercise and balanced diet. Will check CMP and hgbA1c

## 2021-09-08 LAB — COMPREHENSIVE METABOLIC PANEL
ALT: 15 IU/L (ref 0–32)
AST: 17 IU/L (ref 0–40)
Albumin/Globulin Ratio: 1.7 (ref 1.2–2.2)
Albumin: 4.3 g/dL (ref 3.8–4.8)
Alkaline Phosphatase: 42 IU/L — ABNORMAL LOW (ref 44–121)
BUN/Creatinine Ratio: 13 (ref 9–23)
BUN: 12 mg/dL (ref 6–20)
Bilirubin Total: 0.2 mg/dL (ref 0.0–1.2)
CO2: 23 mmol/L (ref 20–29)
Calcium: 9.5 mg/dL (ref 8.7–10.2)
Chloride: 105 mmol/L (ref 96–106)
Creatinine, Ser: 0.91 mg/dL (ref 0.57–1.00)
Globulin, Total: 2.5 g/dL (ref 1.5–4.5)
Glucose: 86 mg/dL (ref 70–99)
Potassium: 4.6 mmol/L (ref 3.5–5.2)
Sodium: 143 mmol/L (ref 134–144)
Total Protein: 6.8 g/dL (ref 6.0–8.5)
eGFR: 86 mL/min/{1.73_m2} (ref >59)

## 2021-09-08 LAB — HEMOGLOBIN A1C
Est. average glucose Bld gHb Est-mCnc: 105 mg/dL
Hgb A1c MFr Bld: 5.3 % (ref 4.8–5.6)

## 2021-09-13 ENCOUNTER — Telehealth: Payer: Self-pay

## 2021-09-13 LAB — CYTOLOGY - PAP
Adequacy: ABNORMAL
Comment: NEGATIVE

## 2021-09-13 NOTE — Telephone Encounter (Signed)
Copied from CRM [id number]. Topic: General - Other >> Sep 13, 2021 12:21 PM Glean Salen wrote: Reason for CRM: Patient needs tos schedule lab appt, first one didn't take. Please call back

## 2021-09-14 NOTE — Telephone Encounter (Signed)
Lmtcb to schedule an appt with Mardella Layman for pap only.

## 2021-10-01 ENCOUNTER — Other Ambulatory Visit: Payer: Self-pay | Admitting: Physician Assistant

## 2021-10-01 ENCOUNTER — Encounter: Payer: Self-pay | Admitting: Physician Assistant

## 2021-10-01 DIAGNOSIS — Z6837 Body mass index (BMI) 37.0-37.9, adult: Secondary | ICD-10-CM

## 2021-10-01 MED ORDER — TIRZEPATIDE 2.5 MG/0.5ML ~~LOC~~ SOAJ: 2.5000 mg | SUBCUTANEOUS | 1 refills | Status: AC

## 2021-11-24 NOTE — Progress Notes (Signed)
°  ° ° °  Established patient visit   Patient: Savannah Callahan   DOB: [birth year]   30 y.o. Female  MRN: [id number] Visit Date: 11/25/2021  Today's healthcare provider: Alfredia Ferguson, PA-C   Chief Complaint  Patient presents with   Sore Throat   Subjective    Savannah Callahan is a 31 y/o female who presents today with a sore throat x 1 month. She states when it first started she was swabbed for strep, COVID, and flu, early 12/22, all negative. She has been managing pain with 800 mg of ibuprofen every 6 hours. She reports an associated hoarseness and swollen glands has improved. Denies fevers, visible sores, abdominal pain, rash, cough, nasal congestion, PND, nausea, vomiting. Denies new sexual partners.     Medications: Outpatient Medications Prior to Visit  Medication Sig   levonorgestrel (MIRENA) 20 MCG/24HR IUD 1 each by Intrauterine route once.   norgestimate-ethinyl estradiol (MONO-LINYAH) 0.25-35 MG-MCG tablet Take 1 tablet by mouth daily.   valACYclovir (VALTREX) 1000 MG tablet Take 1 tablet (1,000 mg total) by mouth 2 (two) times daily.   No facility-administered medications prior to visit.    Review of Systems  Constitutional:  Negative for appetite change, chills, fatigue and fever.  HENT:  Positive for sore throat.   Respiratory:  Negative for chest tightness and shortness of breath.   Cardiovascular:  Negative for chest pain and palpitations.  Gastrointestinal:  Negative for abdominal pain, nausea and vomiting.  Neurological:  Negative for dizziness and weakness.      Objective    BP 104/76 (BP Location: Left Arm, Patient Position: Sitting, Cuff Size: Large)    Pulse 68    Temp 97.9 F (36.6 C) (Oral)    Resp 16    Wt 226 lb (102.5 kg)    SpO2 100% Comment: room air   BMI 38.79 kg/m   Physical Exam Constitutional:      Appearance: She is not ill-appearing.  HENT:     Head: Normocephalic.     Mouth/Throat:     Mouth: Mucous membranes are moist.     Pharynx: Posterior  oropharyngeal erythema present. No pharyngeal swelling or oropharyngeal exudate.     Tonsils: No tonsillar exudate. 0 on the right. 0 on the left.  Eyes:     Conjunctiva/sclera: Conjunctivae normal.  Cardiovascular:     Rate and Rhythm: Normal rate.  Pulmonary:     Effort: Pulmonary effort is normal. No respiratory distress.  Neurological:     General: No focal deficit present.     Mental Status: She is alert and oriented to person, place, and time.  Psychiatric:        Mood and Affect: Mood normal.        Behavior: Behavior normal.     No results found for any visits on 11/25/21.  Assessment & Plan     Pharyngitis Ok to continue ibuprofen, but try to limit use Will rx magic mouthwash to better manage pain Strep swab, r/o mono  Return if symptoms worsen or fail to improve.      I, Alfredia Ferguson, PA-C have reviewed all documentation for this visit. The documentation on  11/25/2021  for the exam, diagnosis, procedures, and orders are all accurate and complete.    Alfredia Ferguson, PA-C  [facility] [phone] (phone) [phone] (fax)  [facility]

## 2021-11-25 ENCOUNTER — Encounter: Payer: Self-pay | Admitting: Physician Assistant

## 2021-11-25 ENCOUNTER — Other Ambulatory Visit: Payer: Self-pay

## 2021-11-25 ENCOUNTER — Ambulatory Visit: Payer: BC Managed Care – PPO | Admitting: Physician Assistant

## 2021-11-25 VITALS — BP 104/76 | HR 68 | Temp 97.9°F | Resp 16 | Wt 226.0 lb

## 2021-11-25 DIAGNOSIS — J029 Acute pharyngitis, unspecified: Secondary | ICD-10-CM

## 2021-11-25 MED ORDER — NYSTATIN 100000 UNIT/ML MT SUSP: 5.0000 mL | Freq: Three times a day (TID) | OROMUCOSAL | 0 refills | Status: DC | PRN

## 2021-11-26 LAB — MONO QUAL W/RFLX QN: Mono Qual W/Rflx Qn: NEGATIVE

## 2021-11-28 LAB — CULTURE, GROUP A STREP: Strep A Culture: NEGATIVE

## 2021-12-03 ENCOUNTER — Ambulatory Visit: Payer: BC Managed Care – PPO

## 2021-12-09 ENCOUNTER — Ambulatory Visit: Payer: Self-pay | Admitting: Nurse Practitioner

## 2021-12-09 ENCOUNTER — Other Ambulatory Visit: Payer: Self-pay

## 2021-12-09 ENCOUNTER — Encounter: Payer: Self-pay | Admitting: Nurse Practitioner

## 2021-12-09 DIAGNOSIS — Z113 Encounter for screening for infections with a predominantly sexual mode of transmission: Secondary | ICD-10-CM

## 2021-12-09 LAB — WET PREP FOR TRICH, YEAST, CLUE
Trichomonas Exam: NEGATIVE
Yeast Exam: NEGATIVE

## 2021-12-09 NOTE — Progress Notes (Signed)
Pt here for STD screening.  Wet mount results reviewed, no treatment required per Provider.  Pt declined condoms. Ardath Lepak M Maxima Skelton, RN  

## 2021-12-09 NOTE — Progress Notes (Signed)
[facility] Department  STI clinic/screening visit [address] [place] [address] [phone]  Subjective:  Diella Gillingham is a 31 y.o. female being seen today for an STI screening visit. The patient reports they do not have symptoms.  Patient reports that they do not desire a pregnancy in the next year.   They reported they are not interested in discussing contraception today.    No LMP recorded (lmp unknown). (Menstrual status: IUD).   Patient has the following medical conditions:   Patient Active Problem List   Diagnosis Date Noted   History of cold sores 09/07/2021   Screening for cervical cancer 09/07/2021   History of HPV infection 09/07/2021   Weight gain 09/07/2021    Chief Complaint  Patient presents with   SEXUALLY TRANSMITTED DISEASE    screening    HPI  Patient reports to clinic for a routine STD screening.   Last HIV test per patient/review of record was 05/19/2020 Patient reports last pap was 09/07/2021.   Screening for MPX risk: Does the patient have an unexplained rash? No Is the patient MSM? No Does the patient endorse multiple sex partners or anonymous sex partners? No Did the patient have close or sexual contact with a person diagnosed with MPX? No Has the patient traveled outside the [place] where MPX is endemic? No Is there a high clinical suspicion for MPX-- evidenced by one of the following No  -Unlikely to be chickenpox  -Lymphadenopathy  -Rash that present in same phase of evolution on any given body part See flowsheet for further details and programmatic requirements.    The following portions of the patient's history were reviewed and updated as appropriate: allergies, current medications, past medical history, past social history, past surgical history and problem list.  Objective:  There were no vitals filed for this visit.  Physical Exam Constitutional:      Appearance: Normal appearance.  HENT:     Head:  Normocephalic.     Mouth/Throat:     Comments: No dental caries noted.  Last dental visit 12/03/21.  Eyes:     Pupils: Pupils are equal, round, and reactive to light.  Pulmonary:     Effort: Pulmonary effort is normal.  Abdominal:     General: Abdomen is flat.     Palpations: Abdomen is soft.  Genitourinary:    Comments: External genitalia/pubic area without nits, lice, edema, erythema, lesions and inguinal adenopathy. Vagina with normal mucosa and discharge. Cervix without visible lesions. Uterus firm, mobile, nt, no masses, no CMT, no adnexal tenderness or fullness. pH 5.0. Musculoskeletal:     Cervical back: Full passive range of motion without pain, normal range of motion and neck supple.  Skin:    General: Skin is warm and dry.  Neurological:     Mental Status: She is alert and oriented to person, place, and time.  Psychiatric:        Attention and Perception: Attention normal.        Mood and Affect: Mood normal.        Behavior: Behavior is cooperative.     Assessment and Plan:  Lexiana Mendia is a 31 y.o. female presenting to the [facility] Department for STI screening  1. Screening examination for venereal disease -31 year old female in clinic today for STD screening. Patient accepted all screenings including oral, vaginal CT/GC and bloodwork for HIV/RPR.  Patient meets criteria for HepB screening? No. Ordered? No - low risk  Patient meets criteria for HepC screening? No. Ordered? No, low risk   Treat wet prep per standing order Discussed time line for State Lab results and that patient will be called with positive results and encouraged patient to call if she had not heard in 2 weeks.  Counseled to return or seek care for continued or worsening symptoms Recommended condom use with all sex  Patient is currently using  IUD  to prevent pregnancy.       Return if symptoms worsen or fail to improve.    Glenna Fellows, FNP

## 2021-12-13 LAB — GONOCOCCUS CULTURE

## 2021-12-23 ENCOUNTER — Ambulatory Visit: Payer: BC Managed Care – PPO | Admitting: Physician Assistant

## 2022-01-11 ENCOUNTER — Encounter: Payer: Self-pay | Admitting: Physician Assistant

## 2022-01-11 ENCOUNTER — Ambulatory Visit (INDEPENDENT_AMBULATORY_CARE_PROVIDER_SITE_OTHER): Payer: BC Managed Care – PPO | Admitting: Physician Assistant

## 2022-01-11 ENCOUNTER — Other Ambulatory Visit (HOSPITAL_COMMUNITY)
Admission: RE | Admit: 2022-01-11 | Discharge: 2022-01-11 | Disposition: A | Discharge status: Home / Self Care | Payer: BC Managed Care – PPO | Source: Ambulatory Visit | Attending: Physician Assistant | Admitting: Physician Assistant

## 2022-01-11 ENCOUNTER — Other Ambulatory Visit: Payer: Self-pay

## 2022-01-11 VITALS — BP 106/74 | HR 59 | Resp 16 | Wt 214.6 lb

## 2022-01-11 DIAGNOSIS — Z124 Encounter for screening for malignant neoplasm of cervix: Secondary | ICD-10-CM | POA: Insufficient documentation

## 2022-01-11 DIAGNOSIS — E669 Obesity, unspecified: Secondary | ICD-10-CM | POA: Diagnosis not present

## 2022-01-11 MED ORDER — WEGOVY 0.25 MG/0.5ML ~~LOC~~ SOAJ: 0.2500 mg | SUBCUTANEOUS | 0 refills | Status: DC

## 2022-01-11 MED ORDER — WEGOVY 0.5 MG/0.5ML ~~LOC~~ SOAJ: 0.5000 mg | SUBCUTANEOUS | 1 refills | Status: DC

## 2022-01-11 NOTE — Progress Notes (Signed)
Established patient visit   Patient: Savannah Callahan   DOB: [birth year]   31 y.o. Female  MRN: [id number] Visit Date: 01/11/2022  Today's healthcare provider: Alfredia Ferguson, PA-C   Chief Complaint  Patient presents with   Gynecologic Exam   Obesity    Patient reports that she would like to discuss medication options of weight loss. Patient reports that in 2022 she was on [facility] and states that she has successful weight loss of 16lbs. Patient states that she is interested in discussing Wegovy since it has been clinically proven to help with weight loss. Patient reports that she is actively exercising 3x a week, she is following a balanced diet of 2-4 meals a day, she reports she has good sleep habits as well.    Subjective    HPI HPI     Obesity    Additional comments: Patient reports that she would like to discuss medication options of weight loss. Patient reports that in 2022 she was on [facility] and states that she has successful weight loss of 16lbs. Patient states that she is interested in discussing Wegovy since it has been clinically proven to help with weight loss. Patient reports that she is actively exercising 3x a week, she is following a balanced diet of 2-4 meals a day, she reports she has good sleep habits as well.         Comments   Patient returns back to clinic today for pap examination      Last edited by Fonda Kinder, CMA on 01/11/2022  9:49 AM.     Savannah Callahan sucessfully lost weight on [place], but reports issues w/ insurance prior auth. Questions about wegovy.   Medications: Outpatient Medications Prior to Visit  Medication Sig   levonorgestrel (MIRENA) 20 MCG/24HR IUD 1 each by Intrauterine route once.   [DISCONTINUED] magic mouthwash (nystatin, lidocaine, diphenhydrAMINE, alum & mag hydroxide) suspension Swish and spit 5 mLs 3 (three) times daily as needed for mouth pain. Gargle with medication for 1-2 minutes and spit out   [DISCONTINUED]  norgestimate-ethinyl estradiol (MONO-LINYAH) 0.25-35 MG-MCG tablet Take 1 tablet by mouth daily. (Patient not taking: Reported on 01/11/2022)   [DISCONTINUED] valACYclovir (VALTREX) 1000 MG tablet Take 1 tablet (1,000 mg total) by mouth 2 (two) times daily. (Patient not taking: Reported on 01/11/2022)   No facility-administered medications prior to visit.    Review of Systems  Constitutional:  Negative for fatigue and fever.  Respiratory:  Negative for cough and shortness of breath.   Cardiovascular:  Negative for chest pain and leg swelling.  Gastrointestinal:  Negative for abdominal pain.  Neurological:  Negative for dizziness and headaches.     Objective    BP 106/74    Pulse (!) 59    Resp 16    Wt 214 lb 9.6 oz (97.3 kg)    SpO2 100%    BMI 36.84 kg/m    Physical Exam Constitutional:      Appearance: She is not ill-appearing.  HENT:     Head: Normocephalic.  Eyes:     Conjunctiva/sclera: Conjunctivae normal.  Cardiovascular:     Rate and Rhythm: Normal rate.  Pulmonary:     Effort: Pulmonary effort is normal. No respiratory distress.  Genitourinary:    Comments: IUD strings present Normal appearing cervix w/ minimal bleeding No visible rashes, lesions on labias/pubic area Neurological:     General: No focal deficit present.     Mental Status: She is alert and  oriented to person, place, and time.  Psychiatric:        Mood and Affect: Mood normal.        Behavior: Behavior normal.     No results found for any visits on 01/11/22.  Assessment & Plan     Problem List Items Addressed This Visit       Other   Screening for cervical cancer - Primary    Repeat d/t inadequate sample the first time.        Relevant Orders   Cytology - PAP   Obesity (BMI 35.0-39.9 without comorbidity)    Discussed wegovy.Discussed purpose of medication and overall weight loss, diet, exercise. 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 We will see how weight loss/symptoms are at this dose.      Relevant Medications   Semaglutide-Weight Management (WEGOVY) 0.25 MG/0.5ML SOAJ   Semaglutide-Weight Management (WEGOVY) 0.5 MG/0.5ML SOAJ     Return in about 3 months (around 04/10/2022) for weight Management.      I, Alfredia Ferguson, PA-C have reviewed all documentation for this visit. The documentation on  01/11/2022  for the exam, diagnosis, procedures, and orders are all accurate and complete.  I,Wallie Lagrand,acting as a Neurosurgeon for [employer], PA-C.,have documented all relevant documentation on the behalf of Alfredia Ferguson, PA-C,as directed by  Alfredia Ferguson, PA-C while in the presence of Alfredia Ferguson, PA-C.   Alfredia Ferguson, PA-C  [facility] [phone] (phone) [phone] (fax)  [facility]

## 2022-01-11 NOTE — Assessment & Plan Note (Signed)
Discussed wegovy.Discussed purpose of medication and overall weight loss, diet, exercise. 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 We will see how weight loss/symptoms are at this dose.

## 2022-01-11 NOTE — Assessment & Plan Note (Signed)
Repeat d/t inadequate sample the first time.

## 2022-01-12 ENCOUNTER — Encounter: Payer: Self-pay | Admitting: Physician Assistant

## 2022-01-17 LAB — CYTOLOGY - PAP: Diagnosis: NEGATIVE

## 2022-01-24 ENCOUNTER — Encounter: Payer: Self-pay | Admitting: Physician Assistant

## 2022-02-03 ENCOUNTER — Telehealth: Payer: Self-pay | Admitting: Physician Assistant

## 2022-02-03 NOTE — Telephone Encounter (Signed)
Semaglutide-Weight Management (WEGOVY) 0.25 MG/0.5ML SOAJ 2 mL 0 01/11/2022    ?Sig - Route: Inject 0.25 mg into the skin once a week. Please start with 0.25 mg weekly for four weeks and then increase to 0.5 mg weekly - Subcutaneous   ?Sent to pharmacy as: Semaglutide-Weight Management (WEGOVY) 0.25 MG/0.5ML Solution Auto-injector   ?E-Prescribing Status: Receipt confirmed by pharmacy (01/11/2022 10:19 AM EST)   ?additi ?Semaglutide-Weight Management (WEGOVY) 0.5 MG/0.5ML SOAJ 2 mL 1 01/11/2022    ?Sig - Route: Inject 0.5 mg into the skin once a week. Please start after using 0.25 mg weekly for four weeks - Subcutaneous   ?Sent to pharmacy as: Semaglutide-Weight Management (WEGOVY) 0.5 MG/0.5ML Solution Auto-injector   ?E-Prescribing Status: Receipt confirmed by pharmacy (01/11/2022 10:19 AM EST)   ? ? Cover My Meds states are both of these valid at once? 2 orders/ also still needs PA. Pls follow up with Cover My Meds at [phone] Ref. [id number] ?onal ?

## 2022-02-22 ENCOUNTER — Encounter: Payer: Self-pay | Admitting: Physician Assistant

## 2022-02-22 ENCOUNTER — Other Ambulatory Visit: Payer: Self-pay | Admitting: Physician Assistant

## 2022-02-22 DIAGNOSIS — N939 Abnormal uterine and vaginal bleeding, unspecified: Secondary | ICD-10-CM

## 2022-02-22 MED ORDER — DROSPIRENONE-ETHINYL ESTRADIOL 3-0.02 MG PO TABS: 1.0000 | ORAL_TABLET | Freq: Every day | ORAL | 3 refills | Status: DC

## 2022-02-22 NOTE — Progress Notes (Signed)
Per pt stopped other ocp but still experiences breakthrough bleeding would like to start yaz ?

## 2022-03-11 ENCOUNTER — Encounter: Payer: Self-pay | Admitting: Physician Assistant

## 2022-03-11 ENCOUNTER — Ambulatory Visit: Payer: BC Managed Care – PPO | Admitting: Physician Assistant

## 2022-03-11 VITALS — BP 108/70 | HR 65 | Temp 98.0°F | Resp 16 | Wt 216.0 lb

## 2022-03-11 DIAGNOSIS — N644 Mastodynia: Secondary | ICD-10-CM

## 2022-03-11 NOTE — Progress Notes (Signed)
?  ? ? ?  Established patient visit ? ? ?Patient: Savannah Callahan   DOB: 21-Apr-1991   30 y.o. Female  MRN: [id number] ?Visit Date: 03/11/2022 ? ?Today's healthcare provider: Alfredia Ferguson, PA-C  ? ?Chief Complaint  ?Patient presents with  ? Breast Pain  ? ?Subjective  ?  ?HPI  ?Patient is here concerning pain in left breast. Pain is located at 3:00 on left breast, with a possible lump. Unsure if related to new birth control. ? ?Medications: ?Outpatient Medications Prior to Visit  ?Medication Sig  ? drospirenone-ethinyl estradiol (YAZ) 3-0.02 MG tablet Take 1 tablet by mouth daily.  ? levonorgestrel (MIRENA) 20 MCG/24HR IUD 1 each by Intrauterine route once.  ? Semaglutide-Weight Management (WEGOVY) 0.25 MG/0.5ML SOAJ Inject 0.25 mg into the skin once a week. Please start with 0.25 mg weekly for four weeks and then increase to 0.5 mg weekly (Patient not taking: Reported on 03/11/2022)  ? Semaglutide-Weight Management (WEGOVY) 0.5 MG/0.5ML SOAJ Inject 0.5 mg into the skin once a week. Please start after using 0.25 mg weekly for four weeks (Patient not taking: Reported on 03/11/2022)  ? ?No facility-administered medications prior to visit.  ? ? ?Review of Systems  ?Constitutional:  Negative for fatigue and fever.  ?Respiratory:  Negative for cough and shortness of breath.   ?Cardiovascular:  Negative for chest pain and leg swelling.  ?Gastrointestinal:  Negative for abdominal pain.  ?Genitourinary:   ?     Breast pain/lump  ?Neurological:  Negative for dizziness and headaches.  ? ? ?  Objective  ?  ?BP 108/70 (BP Location: Right Arm, Patient Position: Sitting, Cuff Size: Large)   Pulse 65   Temp 98 ?F (36.7 ?C) (Temporal)   Resp 16   Wt 216 lb (98 kg)   SpO2 98%   BMI 37.08 kg/m?  ? ? ?Physical Exam ?Vitals reviewed.  ?Constitutional:   ?   Appearance: She is not ill-appearing.  ?HENT:  ?   Head: Normocephalic.  ?Eyes:  ?   Conjunctiva/sclera: Conjunctivae normal.  ?Cardiovascular:  ?   Rate and Rhythm: Normal rate.   ?Pulmonary:  ?   Effort: Pulmonary effort is normal. No respiratory distress.  ?Chest:  ?   Chest wall: Tenderness present. No mass or swelling.  ?Breasts: ?   Right: No swelling, inverted nipple, mass, nipple discharge or skin change.  ?   Left: No swelling, inverted nipple, mass, nipple discharge or skin change.  ?   Comments: Tenderness to L breast 3:00-4:00, 5-6 cmfn. No associated mass ?Neurological:  ?   General: No focal deficit present.  ?   Mental Status: She is alert and oriented to person, place, and time.  ?Psychiatric:     ?   Mood and Affect: Mood normal.     ?   Behavior: Behavior normal.  ?  ? ? ?No results found for any visits on 03/11/22. ? Assessment & Plan  ?  ? ?Left breast tenderness ?No abnormalities on exam ?Will order L breast sono ? ? ?   ? ?I, Alfredia Ferguson, PA-C have reviewed all documentation for this visit. The documentation on 03/11/2022 for the exam, diagnosis, procedures, and orders are all accurate and complete. ? ?Alfredia Ferguson, PA-C ? Family Practice ?[address] ?[place], [place], [address] ?Office: [phone] ?Fax: [phone]  ? ?[facility] ?

## 2022-03-16 ENCOUNTER — Telehealth: Payer: Self-pay | Admitting: *Deleted

## 2022-03-16 ENCOUNTER — Other Ambulatory Visit: Payer: Self-pay | Admitting: Physician Assistant

## 2022-03-16 DIAGNOSIS — N644 Mastodynia: Secondary | ICD-10-CM

## 2022-03-16 NOTE — Telephone Encounter (Signed)
Copied from [place] #[id number]. Topic: General - Other ?>> Mar 15, 2022  4:28 PM Parke Poisson wrote: ?Reason for CRM: Pt will need orders for diagnostic bilateral mammogram TOMO and limited right breast ultrasound,Thanks ?

## 2022-04-01 ENCOUNTER — Other Ambulatory Visit: Payer: BC Managed Care – PPO

## 2022-04-12 NOTE — Progress Notes (Unsigned)
     I,Sha'taria Kelan Pritt,acting as a Neurosurgeon for [employer], PA-C.,have documented all relevant documentation on the behalf of Alfredia Ferguson, PA-C,as directed by  Alfredia Ferguson, PA-C while in the presence of Alfredia Ferguson, PA-C.   Established patient visit   Patient: Savannah Callahan   DOB: 02/28/1991   31 y.o. Female  MRN: [id number] Visit Date: 04/13/2022  Today's healthcare provider: Alfredia Ferguson, PA-C   No chief complaint on file.  Subjective    HPI  Patient reports hard spot on her leg that she would like to make sure it is not a blood clot.   Medications: Outpatient Medications Prior to Visit  Medication Sig   drospirenone-ethinyl estradiol (YAZ) 3-0.02 MG tablet Take 1 tablet by mouth daily.   levonorgestrel (MIRENA) 20 MCG/24HR IUD 1 each by Intrauterine route once.   Semaglutide-Weight Management (WEGOVY) 0.25 MG/0.5ML SOAJ Inject 0.25 mg into the skin once a week. Please start with 0.25 mg weekly for four weeks and then increase to 0.5 mg weekly (Patient not taking: Reported on 03/11/2022)   Semaglutide-Weight Management (WEGOVY) 0.5 MG/0.5ML SOAJ Inject 0.5 mg into the skin once a week. Please start after using 0.25 mg weekly for four weeks (Patient not taking: Reported on 03/11/2022)   No facility-administered medications prior to visit.    Review of Systems  {Labs  Heme  Chem  Endocrine  Serology  Results Review (optional):23779}   Objective    There were no vitals taken for this visit. {Show previous vital signs (optional):23777}  Physical Exam  ***  No results found for any visits on 04/13/22.  Assessment & Plan     ***  No follow-ups on file.      {provider attestation***:1}   Alfredia Ferguson, PA-C  [facility] [phone] (phone) [phone] (fax)  [facility]

## 2022-04-13 ENCOUNTER — Encounter: Payer: Self-pay | Admitting: Emergency Medicine

## 2022-04-13 ENCOUNTER — Emergency Department
Admission: EM | Admit: 2022-04-13 | Discharge: 2022-04-13 | Disposition: A | Discharge status: Home / Self Care | Payer: BC Managed Care – PPO | Attending: Emergency Medicine | Admitting: Emergency Medicine

## 2022-04-13 ENCOUNTER — Emergency Department: Payer: BC Managed Care – PPO

## 2022-04-13 ENCOUNTER — Ambulatory Visit: Payer: BC Managed Care – PPO | Admitting: Physician Assistant

## 2022-04-13 ENCOUNTER — Other Ambulatory Visit: Payer: Self-pay

## 2022-04-13 DIAGNOSIS — M7981 Nontraumatic hematoma of soft tissue: Secondary | ICD-10-CM | POA: Insufficient documentation

## 2022-04-13 DIAGNOSIS — M79605 Pain in left leg: Secondary | ICD-10-CM | POA: Insufficient documentation

## 2022-04-13 DIAGNOSIS — T148XXA Other injury of unspecified body region, initial encounter: Secondary | ICD-10-CM

## 2022-04-13 NOTE — ED Notes (Signed)
Discharge instructions including follow up care and pain management discussed with pt. Pt verbalized understanding with no questions at this time. 

## 2022-04-13 NOTE — Discharge Instructions (Addendum)
We believe you have a small area of tissue in the left lateral upper thigh that is still healing after your fall.  This can be a small collection of blood (hematoma) or a small area of what is called "fat necrosis" which is part of the healing process.  There is no sign that you have a deep vein thrombosis.  You can use heating pads or ice packs (whichever is more comfortable) on the area on your thigh and use over-the-counter ibuprofen and/or Tylenol as needed.  Follow-up with your regular provider.  Return to the emergency department if you develop new or worsening symptoms that concern you.

## 2022-04-13 NOTE — ED Provider Notes (Signed)
[facility] Provider Note    Event Date/Time   First MD Initiated Contact with Patient 04/13/22 [phone]     (approximate)   History   Leg Pain   HPI  Savannah Callahan is a 31 y.o. female  who presents for evaluation of a painful and swollen area on her left lateral thigh and concern for DVT.  She fell a couple of months ago and had a large bruise and swollen area in that same spot.  It mostly resolved but she had a persistent small "knot"  on the left lateral upper thigh.  Today she got off a plane from [place] after 7-day cruise and it seemed bigger and more painful and she was worried about the possibility of DVT so she came to the ED for evaluation.  No systemic symptoms.  No lightheadedness nor dizziness, no difficulty breathing, no fever, no other pain anywhere else on her body including behind her knee or in her lower leg.      Physical Exam   Triage Vital Signs: ED Triage Vitals  Enc Vitals Group     BP 04/13/22 0059 (!) 137/97     Pulse Rate 04/13/22 0059 91     Resp 04/13/22 0059 18     Temp 04/13/22 0059 98.2 F (36.8 C)     Temp Source 04/13/22 0059 Oral     SpO2 04/13/22 0059 99 %     Weight 04/13/22 0059 97.5 kg (215 lb)     Height 04/13/22 0059 1.651 m (5\' 5" )     Head Circumference --      Peak Flow --      Pain Score 04/13/22 0116 1     Pain Loc --      Pain Edu? --      Excl. in GC? --     Most recent vital signs: Vitals:   04/13/22 0059  BP: (!) 137/97  Pulse: 91  Resp: 18  Temp: 98.2 F (36.8 C)  SpO2: 99%     General: Awake, no distress.  CV:  Good peripheral perfusion.  Resp:  Normal effort.  Abd:  No distention.  Other:  Patient has a dime size indurated area on the left lateral upper thigh consistent with her area of trauma.  It is difficult to appreciate but there may be a small amount of surrounding erythema but it does not appear consistent with infection.     ED Results / Procedures / Treatments    Labs (all labs ordered are listed, but only abnormal results are displayed) Labs Reviewed - No data to display   RADIOLOGY I viewed and interpreted the patient's ultrasound and I see no evidence of clot or obstruction.  The radiologist did not identify a DVT and also mentions a small area of probable fat necrosis in the setting of the prior trauma.    PROCEDURES:  Critical Care performed: No  Procedures   MEDICATIONS ORDERED IN ED: Medications - No data to display   IMPRESSION / MDM / ASSESSMENT AND PLAN / ED COURSE  I reviewed the triage vital signs and the nursing notes.                              Differential diagnosis includes, but is not limited to, hematoma, fat necrosis, cellulitis, DVT.  Patient's presentation is most consistent with acute, uncomplicated illness.  Vital signs are stable and within normal limits,  no systemic symptoms, ultrasound as documented above does not demonstrate DVT in the area of concern is not consistent with venous distribution.  Most likely she still has some cutaneous changes from the trauma and some concern after her long distance travel.  I updated her and explained about DVT versus cutaneous hematoma and she understands.  She is comfortable with the plan for discharge and outpatient follow-up.  No indication for medications at this time.       FINAL CLINICAL IMPRESSION(S) / ED DIAGNOSES   Final diagnoses:  Hematoma     Rx / DC Orders   ED Discharge Orders     None        Note:  This document was prepared using Dragon voice recognition software and may include unintentional dictation errors.   Loleta Rose, MD 04/13/22 [phone]

## 2022-04-13 NOTE — ED Triage Notes (Signed)
Patient ambulatory to triage with steady gait, without difficulty or distress noted; pt reports fall several mos ago; yesterday came back from a 4hr flight and noted the knot to outer left thigh is more tender; pt st she is concerned over a blood clot

## 2022-04-19 ENCOUNTER — Ambulatory Visit: Admission: RE | Admit: 2022-04-19 | Payer: BC Managed Care – PPO | Source: Ambulatory Visit

## 2022-04-19 ENCOUNTER — Ambulatory Visit
Admission: RE | Admit: 2022-04-19 | Discharge: 2022-04-19 | Disposition: A | Discharge status: Home / Self Care | Payer: BC Managed Care – PPO | Source: Ambulatory Visit | Attending: Physician Assistant | Admitting: Physician Assistant

## 2022-04-19 DIAGNOSIS — N644 Mastodynia: Secondary | ICD-10-CM | POA: Diagnosis present

## 2022-05-17 ENCOUNTER — Ambulatory Visit: Payer: BC Managed Care – PPO | Admitting: Physician Assistant

## 2022-08-22 ENCOUNTER — Telehealth: Payer: Self-pay

## 2022-08-22 NOTE — Telephone Encounter (Signed)
Spoke with patient and advised. She verbalized understanding. States she will contact insurance to see if appointment tomorrow will be covered as a yearly physical or if she should reschedule. 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

## 2022-08-22 NOTE — Progress Notes (Signed)
I,Sha'taria Tyson,acting as a Neurosurgeon for [employer], PA-C.,have documented all relevant documentation on the behalf of Alfredia Ferguson, PA-C,as directed by  Alfredia Ferguson, PA-C while in the presence of Alfredia Ferguson, PA-C.  Complete physical exam   Patient: Savannah Callahan   DOB: January 01, 1991   31 y.o. Female  MRN: [id number] Visit Date: 08/23/2022  Today's healthcare provider: Alfredia Ferguson, PA-C   Cc. cpe  Subjective    Savannah Callahan is a 31 y.o. female who presents today for a complete physical exam.  She reports consuming a  keto (low carb and low sugar)  diet.  The patient reports softball kickball for 30 minutes twice a week.  She generally feels well. She reports sleeping well. She does not have additional problems to discuss today.  HPI   Past Medical History:  Diagnosis Date   Depression    Past Surgical History:  Procedure Laterality Date   CESAREAN SECTION N/A 12/22/2016   Procedure: CESAREAN SECTION;  Surgeon: Nadara Mustard, MD;  Location: [facility] ORS;  Service: Obstetrics;  Laterality: N/A;   Social History   Socioeconomic History   Marital status: Divorced    Spouse name: Not on file   Number of children: 1   Years of education: Not on file   Highest education level: Not on file  Occupational History   Not on file  Tobacco Use   Smoking status: Never   Smokeless tobacco: Never  Vaping Use   Vaping Use: Never used  Substance and Sexual Activity   Alcohol use: Yes    Alcohol/week: 1.0 standard drink of alcohol    Types: 1 Glasses of wine per week    Comment: occassionally   Drug use: No   Sexual activity: Yes    Birth control/protection: I.U.D.  Other Topics Concern   Not on file  Social History Narrative   Not on file   Social Determinants of Health   Financial Resource Strain: Not on file  Food Insecurity: Not on file  Transportation Needs: Not on file  Physical Activity: Not on file  Stress: Not on file  Social Connections: Not on  file  Intimate Partner Violence: Unknown (12/09/2021)   Humiliation, Afraid, Rape, and Kick questionnaire    Fear of Current or Ex-Partner: No    Emotionally Abused: No    Physically Abused: No    Sexually Abused: Not on file   Family Status  Relation Name Status   Mother  Alive   Father  Alive   Brother  Alive   MGM  Alive   MGF  Alive   PGM  Alive   PGF  Deceased       MVA   Family History  Problem Relation Age of Onset   Stroke Maternal Grandmother    No Known Allergies  Patient Care Team: Alfredia Ferguson, PA-C as PCP - General (Physician Assistant)   Medications: Outpatient Medications Prior to Visit  Medication Sig   drospirenone-ethinyl estradiol (YAZ) 3-0.02 MG tablet Take 1 tablet by mouth daily.   levonorgestrel (MIRENA) 20 MCG/24HR IUD 1 each by Intrauterine route once.   valACYclovir (VALTREX) 1000 MG tablet Take 1,000 mg by mouth 2 (two) times daily. As needed   [DISCONTINUED] Semaglutide-Weight Management (WEGOVY) 0.25 MG/0.5ML SOAJ Inject 0.25 mg into the skin once a week. Please start with 0.25 mg weekly for four weeks and then increase to 0.5 mg weekly (Patient not taking: Reported on 08/23/2022)   [DISCONTINUED] Semaglutide-Weight Management (WEGOVY) 0.5  MG/0.5ML SOAJ Inject 0.5 mg into the skin once a week. Please start after using 0.25 mg weekly for four weeks (Patient not taking: Reported on 08/23/2022)   No facility-administered medications prior to visit.    Review of Systems  Constitutional:  Negative for fatigue and fever.  Respiratory:  Negative for cough and shortness of breath.   Cardiovascular:  Negative for chest pain and leg swelling.  Gastrointestinal:  Negative for abdominal pain.  Neurological:  Negative for dizziness and headaches.      Objective    Blood pressure (!) 110/54, pulse 66, height $RemoveBe'5\' 4"'pCyaRMHCb$  (1.626 m), weight 206 lb 12.8 oz (93.8 kg), unknown if currently breastfeeding.    Physical Exam Constitutional:      General: She is  awake.     Appearance: She is well-developed. She is not ill-appearing.  HENT:     Head: Normocephalic.     Right Ear: Tympanic membrane normal.     Left Ear: Tympanic membrane normal.     Nose: Nose normal. No congestion or rhinorrhea.     Mouth/Throat:     Pharynx: No oropharyngeal exudate or posterior oropharyngeal erythema.  Eyes:     Conjunctiva/sclera: Conjunctivae normal.     Pupils: Pupils are equal, round, and reactive to light.  Neck:     Thyroid: No thyroid mass or thyromegaly.  Cardiovascular:     Rate and Rhythm: Normal rate and regular rhythm.     Heart sounds: Normal heart sounds.  Pulmonary:     Effort: Pulmonary effort is normal.     Breath sounds: Normal breath sounds.  Abdominal:     Palpations: Abdomen is soft.     Tenderness: There is no abdominal tenderness.  Musculoskeletal:     Right lower leg: No swelling. No edema.     Left lower leg: No swelling. No edema.  Lymphadenopathy:     Cervical: No cervical adenopathy.  Skin:    General: Skin is warm.  Neurological:     Mental Status: She is alert and oriented to person, place, and time.  Psychiatric:        Attention and Perception: Attention normal.        Mood and Affect: Mood normal.        Speech: Speech normal.        Behavior: Behavior normal. Behavior is cooperative.     Last depression screening scores    01/11/2022    9:53 AM 08/10/2020    2:17 PM 05/06/2019    2:12 PM  PHQ 2/9 Scores  PHQ - 2 Score 0 0 1  PHQ- 9 Score $Remov'2 1 1   'QVXiOT$ Last fall risk screening    08/10/2020    2:16 PM  Fall Risk   Falls in the past year? 0  Number falls in past yr: 0  Injury with Fall? 0  Risk for fall due to : No Fall Risks  Follow up Falls evaluation completed   Last Audit-C alcohol use screening    08/10/2020    2:16 PM  Alcohol Use Disorder Test (AUDIT)  1. How often do you have a drink containing alcohol? 2  2. How many drinks containing alcohol do you have on a typical day when you are drinking? 1   3. How often do you have six or more drinks on one occasion? 0  AUDIT-C Score 3  4. How often during the last year have you found that you were not able to stop drinking once  you had started? 0  5. How often during the last year have you failed to do what was normally expected from you because of drinking? 0  6. How often during the last year have you needed a first drink in the morning to get yourself going after a heavy drinking session? 0  7. How often during the last year have you had a feeling of guilt of remorse after drinking? 0  8. How often during the last year have you been unable to remember what happened the night before because you had been drinking? 0  9. Have you or someone else been injured as a result of your drinking? 0  10. Has a relative or friend or a doctor or another health worker been concerned about your drinking or suggested you cut down? 0  Alcohol Use Disorder Identification Test Final Score (AUDIT) 3   A score of 3 or more in women, and 4 or more in men indicates increased risk for alcohol abuse, EXCEPT if all of the points are from question 1   No results found for any visits on 08/23/22.  Assessment & Plan    Routine Health Maintenance and Physical Exam  Exercise Activities and Dietary recommendations --balanced diet high in fiber and protein, low in sugars, carbs, fats. --physical activity/exercise 30 minutes 3-5 times a week     Immunization History  Administered Date(s) Administered   DTaP 05/26/1989, 08/30/1989, 12/05/1989, 09/21/1990, 03/11/1994   HIB (PRP-OMP) 01/05/1990, 03/14/1990, 09/21/1990   Hepatitis B 01/15/1994, 03/11/1994, 09/09/1994   Hpv-Unspecified 06/30/2008, 09/12/2008, 02/19/2009   IPV 08/30/1989, 12/05/1989, 09/21/1990, 02/19/1994   Influenza Inj Mdck Quad Pf 08/08/2018, 07/27/2019, 08/31/2021, 07/13/2022   MMR 07/03/1990, 03/11/1994   Meningococcal Conjugate 05/09/2007   Td 06/24/2004   Tdap 01/16/2019    Health Maintenance   Topic Date Due   COVID-19 Vaccine (1) Never done   Hepatitis C Screening  Never done   PAP SMEAR-Modifier  01/11/2023   TETANUS/TDAP  01/16/2029   INFLUENZA VACCINE  Completed   HPV VACCINES  Completed   HIV Screening  Completed    Discussed health benefits of physical activity, and encouraged her to engage in regular exercise appropriate for her age and condition.  Problem List Items Addressed This Visit       Other   Obesity (BMI 30-39.9)    10/22 weight 216 lbs Today 206 lbs pt doing a keto diet Continue balanced diet and regular exercise. Screening labs ordered, lipid, a1c      Relevant Orders   CBC w/Diff/Platelet   Comprehensive Metabolic Panel (CMET)   TSH   Lipid Profile   Other Visit Diagnoses     Annual physical exam    -  Primary   Relevant Orders   CBC w/Diff/Platelet   Comprehensive Metabolic Panel (CMET)   TSH   Lipid Profile   Screening-pulmonary TB       Relevant Orders   QuantiFERON-TB Gold Plus      Screening for TB done for employment  Return in about 1 year (around 08/24/2023) for CPE.     I, Mikey Kirschner, PA-C have reviewed all documentation for this visit. The documentation on  08/23/2022 for the exam, diagnosis, procedures, and orders are all accurate and complete.  Mikey Kirschner, PA-C [facility] [address] [place], [place], [address] Office: [phone] Fax: [place]

## 2022-08-23 ENCOUNTER — Ambulatory Visit: Payer: BC Managed Care – PPO | Admitting: Physician Assistant

## 2022-08-23 ENCOUNTER — Encounter: Payer: Self-pay | Admitting: Physician Assistant

## 2022-08-23 VITALS — BP 110/54 | HR 66 | Ht 64.0 in | Wt 206.8 lb

## 2022-08-23 DIAGNOSIS — Z0000 Encounter for general adult medical examination without abnormal findings: Secondary | ICD-10-CM

## 2022-08-23 DIAGNOSIS — Z111 Encounter for screening for respiratory tuberculosis: Secondary | ICD-10-CM

## 2022-08-23 DIAGNOSIS — E669 Obesity, unspecified: Secondary | ICD-10-CM | POA: Diagnosis not present

## 2022-08-23 NOTE — Assessment & Plan Note (Signed)
10/22 weight 216 lbs Today 206 lbs pt doing a keto diet Continue balanced diet and regular exercise. Screening labs ordered, lipid, a1c

## 2022-08-26 ENCOUNTER — Encounter: Payer: Self-pay | Admitting: Physician Assistant

## 2022-08-26 LAB — COMPREHENSIVE METABOLIC PANEL
ALT: 12 IU/L (ref 0–32)
AST: 15 IU/L (ref 0–40)
Albumin/Globulin Ratio: 2 (ref 1.2–2.2)
Albumin: 4.4 g/dL (ref 3.9–4.9)
Alkaline Phosphatase: 47 IU/L (ref 44–121)
BUN/Creatinine Ratio: 22 (ref 9–23)
BUN: 20 mg/dL (ref 6–20)
Bilirubin Total: 0.3 mg/dL (ref 0.0–1.2)
CO2: 23 mmol/L (ref 20–29)
Calcium: 9.6 mg/dL (ref 8.7–10.2)
Chloride: 104 mmol/L (ref 96–106)
Creatinine, Ser: 0.9 mg/dL (ref 0.57–1.00)
Globulin, Total: 2.2 g/dL (ref 1.5–4.5)
Glucose: 98 mg/dL (ref 70–99)
Potassium: 4.5 mmol/L (ref 3.5–5.2)
Sodium: 140 mmol/L (ref 134–144)
Total Protein: 6.6 g/dL (ref 6.0–8.5)
eGFR: 87 mL/min/{1.73_m2} (ref >59)

## 2022-08-26 LAB — CBC WITH DIFFERENTIAL/PLATELET
Basophils Absolute: 0.1 10*3/uL (ref 0.0–0.2)
Basos: 1 %
EOS (ABSOLUTE): 0.1 10*3/uL (ref 0.0–0.4)
Eos: 2 %
Hematocrit: 39.7 % (ref 34.0–46.6)
Hemoglobin: 13.1 g/dL (ref 11.1–15.9)
Immature Grans (Abs): 0 10*3/uL (ref 0.0–0.1)
Immature Granulocytes: 0 %
Lymphocytes Absolute: 2.2 10*3/uL (ref 0.7–3.1)
Lymphs: 33 %
MCH: 30 pg (ref 26.6–33.0)
MCHC: 33 g/dL (ref 31.5–35.7)
MCV: 91 fL (ref 79–97)
Monocytes Absolute: 0.8 10*3/uL (ref 0.1–0.9)
Monocytes: 12 %
Neutrophils Absolute: 3.6 10*3/uL (ref 1.4–7.0)
Neutrophils: 52 %
Platelets: 241 10*3/uL (ref 150–450)
RBC: 4.37 x10E6/uL (ref 3.77–5.28)
RDW: 11.9 % (ref 11.7–15.4)
WBC: 6.7 10*3/uL (ref 3.4–10.8)

## 2022-08-26 LAB — QUANTIFERON-TB GOLD PLUS
QuantiFERON Mitogen Value: >10 IU/mL
QuantiFERON Nil Value: 0.03 IU/mL
QuantiFERON TB1 Ag Value: 0.27 IU/mL
QuantiFERON TB2 Ag Value: 0.06 IU/mL
QuantiFERON-TB Gold Plus: NEGATIVE

## 2022-08-26 LAB — LIPID PANEL
Chol/HDL Ratio: 2.4 ratio (ref 0.0–4.4)
Cholesterol, Total: 153 mg/dL (ref 100–199)
HDL: 65 mg/dL (ref >39)
LDL Chol Calc (NIH): 77 mg/dL (ref 0–99)
Triglycerides: 54 mg/dL (ref 0–149)
VLDL Cholesterol Cal: 11 mg/dL (ref 5–40)

## 2022-08-26 LAB — TSH: TSH: 1.65 u[IU]/mL (ref 0.450–4.500)

## 2022-11-24 ENCOUNTER — Telehealth: Payer: BC Managed Care – PPO | Admitting: Physician Assistant

## 2022-11-24 DIAGNOSIS — L700 Acne vulgaris: Secondary | ICD-10-CM

## 2022-11-24 MED ORDER — TRETINOIN 0.05 % EX CREA: TOPICAL_CREAM | Freq: Every day | CUTANEOUS | 0 refills | Status: AC

## 2022-11-24 NOTE — Progress Notes (Signed)
We are sorry that you are experiencing this issue.  Here is how we plan to help!  Based on what you shared with me it looks like you have uncomplicated acne.  Acne is a disorder of the hair follicles and oil glands (sebaceous glands). The sebaceous glands secrete oils to keep the skin moist.  When the glands get clogged, it can lead to pimples or cysts.  These cysts may become infected and leave scars. Acne is very common and normally occurs at puberty.  Acne is also inherited.  Your personal care plan consists of the following recommendations:  I recommend that you use a daily cleanser  You may try a topical exfoliator and salicylic acid scrub.  These scrubs have coarse particles that clear your pores but may also irritate your skin.  I have prescribed a topical cream: Tretinoin 0.5% (Retin-A) Apply topically daily at bedtime  If excessive dryness or peeling occurs, reduce dose frequency or concentration of the topical scrubs.  If excessive stinging or burning occurs, remove the topical gel with mild soap and water and resume at a lower dose the next day.  Remember oral antibiotics and topical acne treatments may increase your sensitivity to the sun!  HOME CARE: Do not squeeze pimples because that can often lead to infections, worse acne, and scars. Use a moisturizer that contains retinoid or fruit acids that may inhibit the development of new acne lesions. Although there is not a clear link that foods can cause acne, doctors do believe that too many sweets predispose you to skin problems.  GET HELP RIGHT AWAY IF: If your acne gets worse or is not better within 10 days. If you become depressed. If you become pregnant, discontinue medications and call your OB/GYN.  MAKE SURE YOU: Understand these instructions. Will watch your condition. Will get help right away if you are not doing well or get worse.  Thank you for choosing an e-visit.  Your e-visit answers were reviewed by a board  certified advanced clinical practitioner to complete your personal care plan. Depending upon the condition, your plan could have included both over the counter or prescription medications.  Please review your pharmacy choice. Make sure the pharmacy is open so you can pick up prescription now. If there is a problem, you may contact your provider through [employer] and have the prescription routed to another pharmacy.  Your safety is important to [place]. If you have drug allergies check your prescription carefully.   For the next 24 hours you can use MyChart to ask questions about today's visit, request a non-urgent call back, or ask for a work or school excuse. You will get an email in the next two days asking about your experience. I hope that your e-visit has been valuable and will speed your recovery.  I have spent 5 minutes in review of e-visit questionnaire, review and updating patient chart, medical decision making and response to patient.   Mar Daring, PA-C

## 2022-12-12 IMAGING — MG DIGITAL DIAGNOSTIC BILAT W/ TOMO W/ CAD
8 of 14 series · 8 of 40 positions shown · non-contrast
Comparison: Baseline exam.

CLINICAL DATA: LEFT-sided breast pain.  Since resolved.

EXAM:
DIGITAL DIAGNOSTIC BILATERAL MAMMOGRAM WITH TOMOSYNTHESIS AND CAD
TECHNIQUE: Bilateral digital diagnostic mammography and breast tomosynthesis
was performed. The images were evaluated with computer-aided
detection.

[L MLO synth-2D]
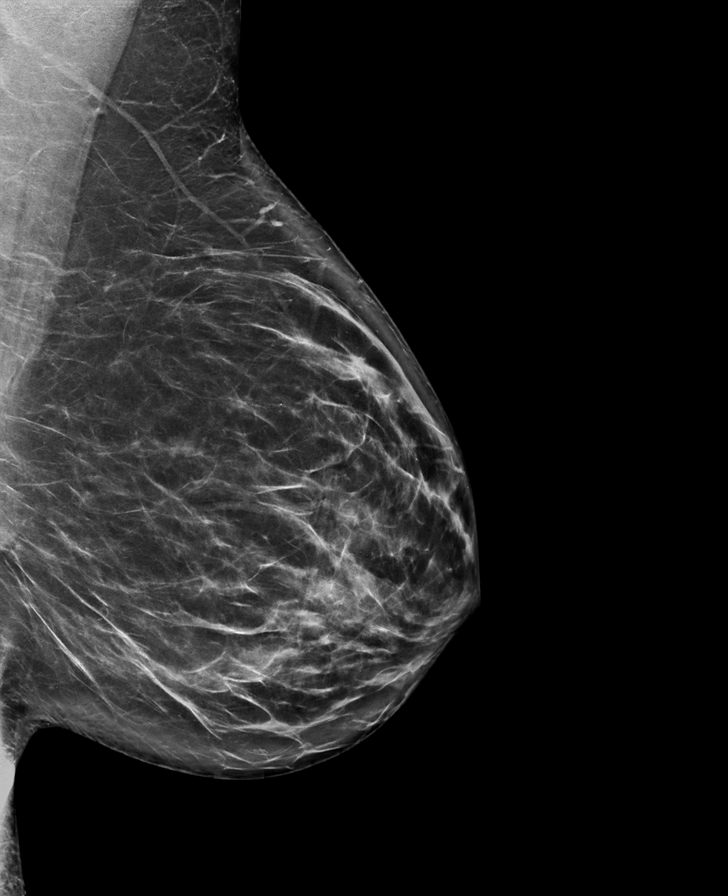

[R MLO synth-2D (1 of 2)]
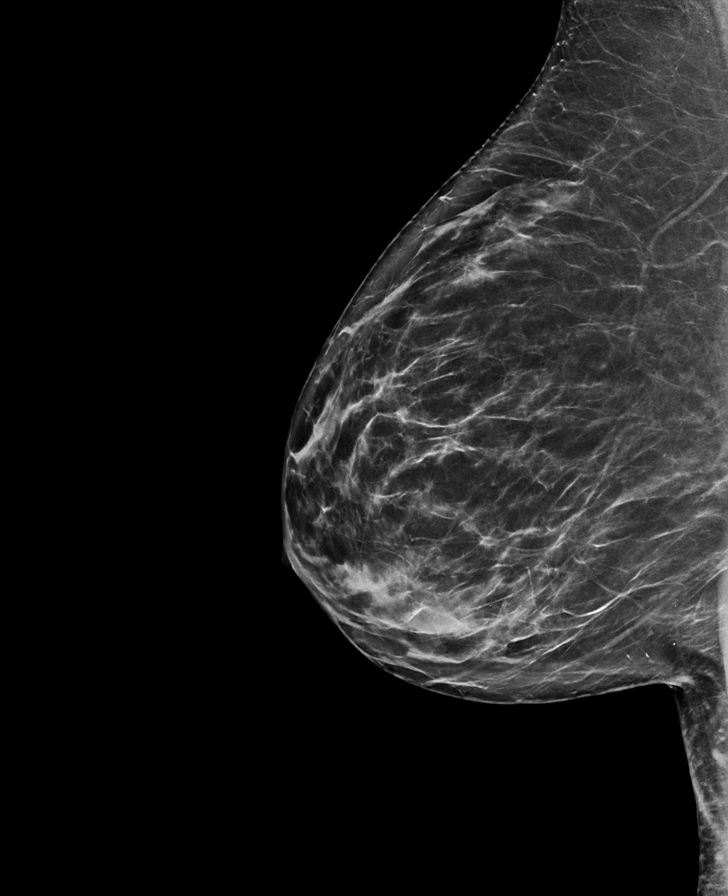

[L CC synth-2D (1 of 3)]
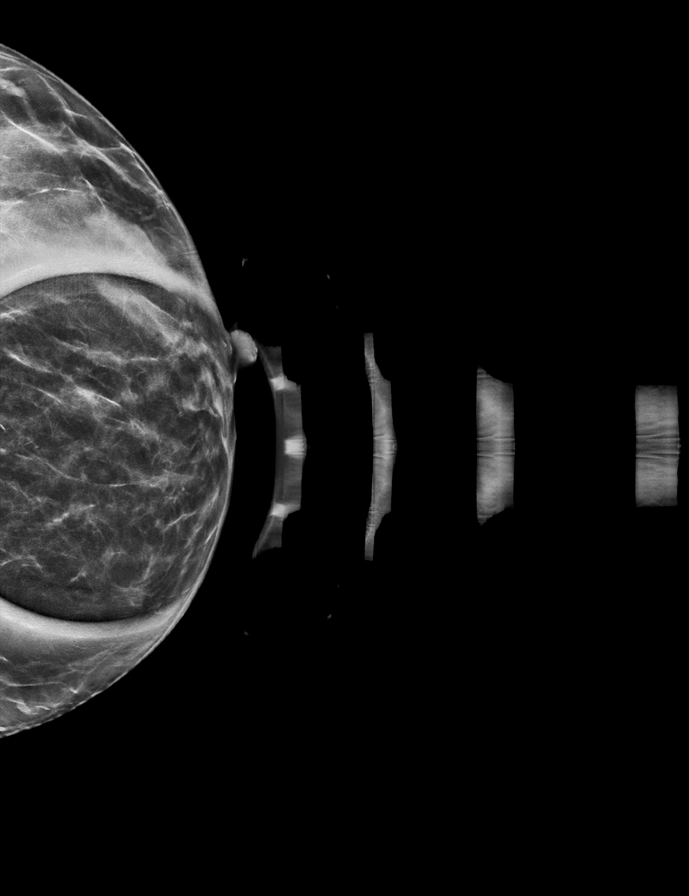

[L CC synth-2D (2 of 3)]
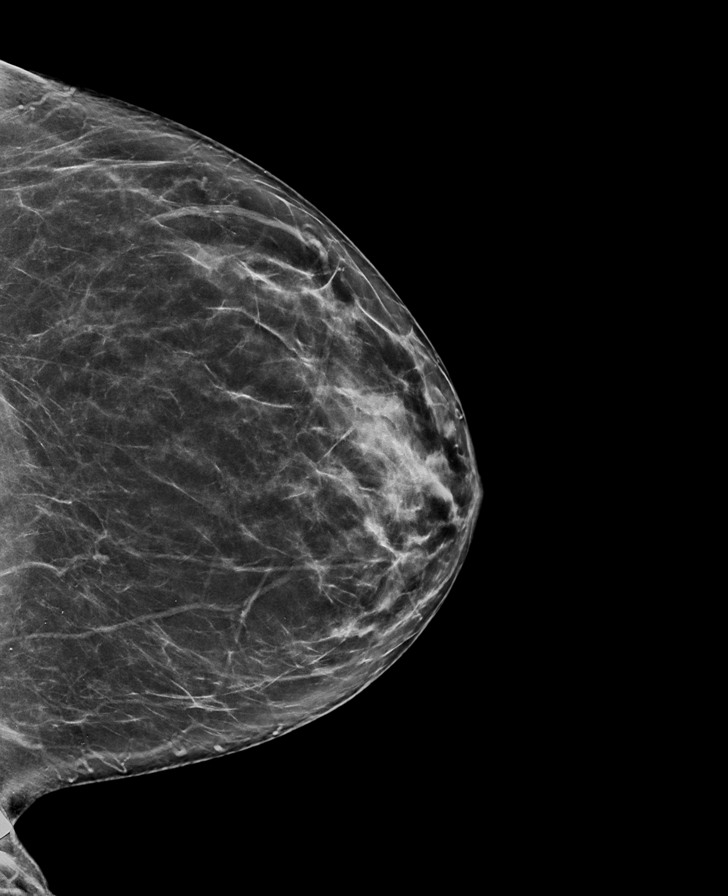

[R MLO synth-2D (2 of 2)]
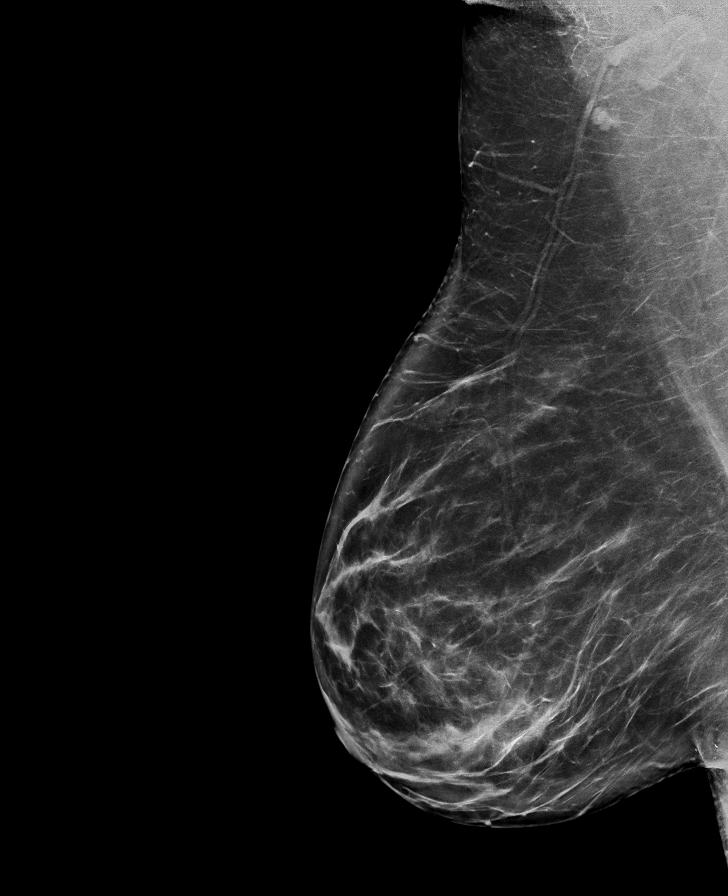

[L CC synth-2D (3 of 3)]
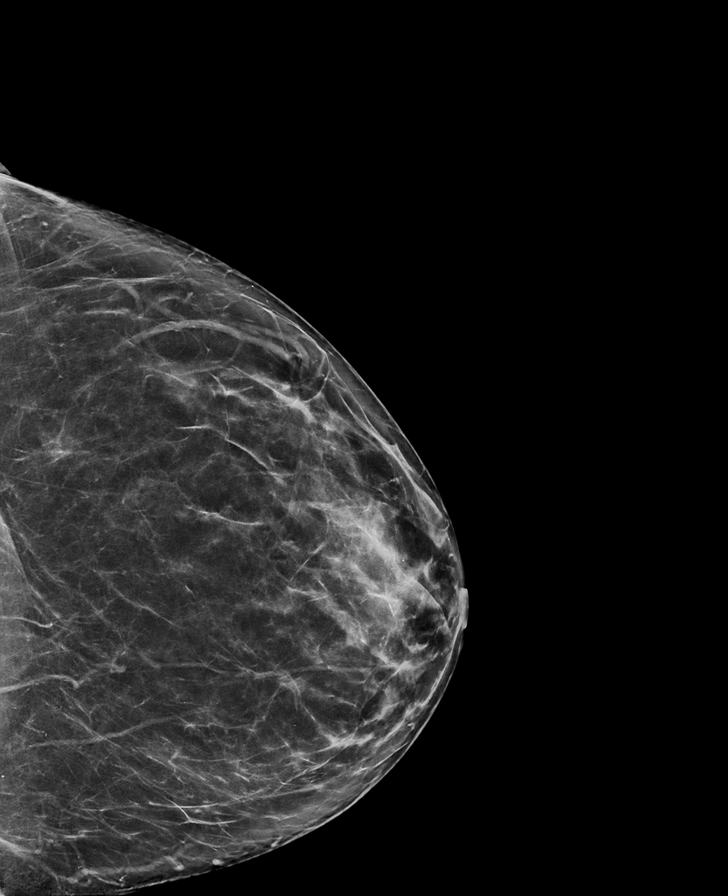

[R CC synth-2D]
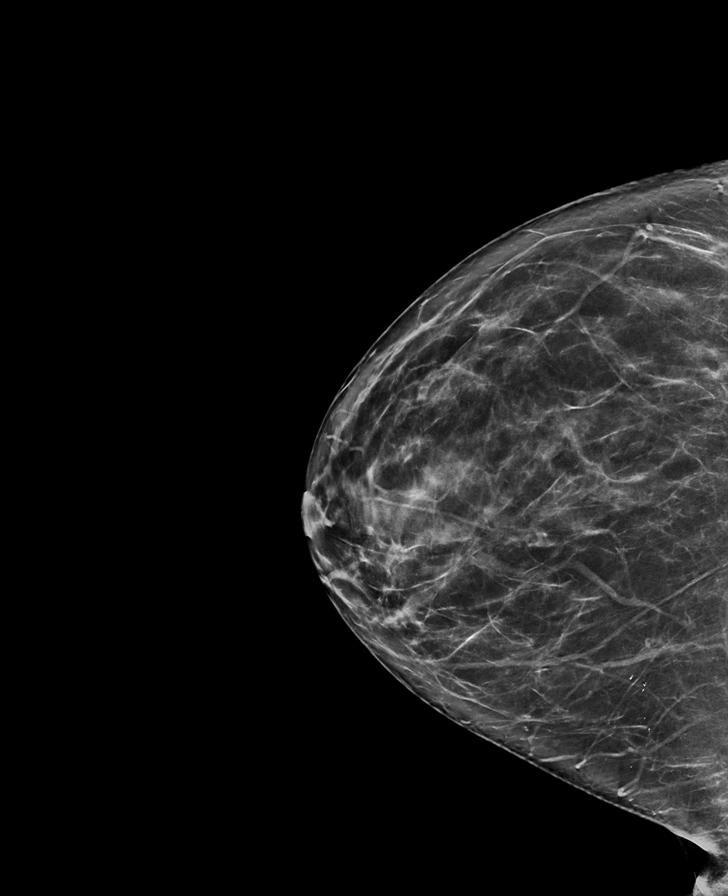

[L CC tomo · tomo slice 43/84.0]
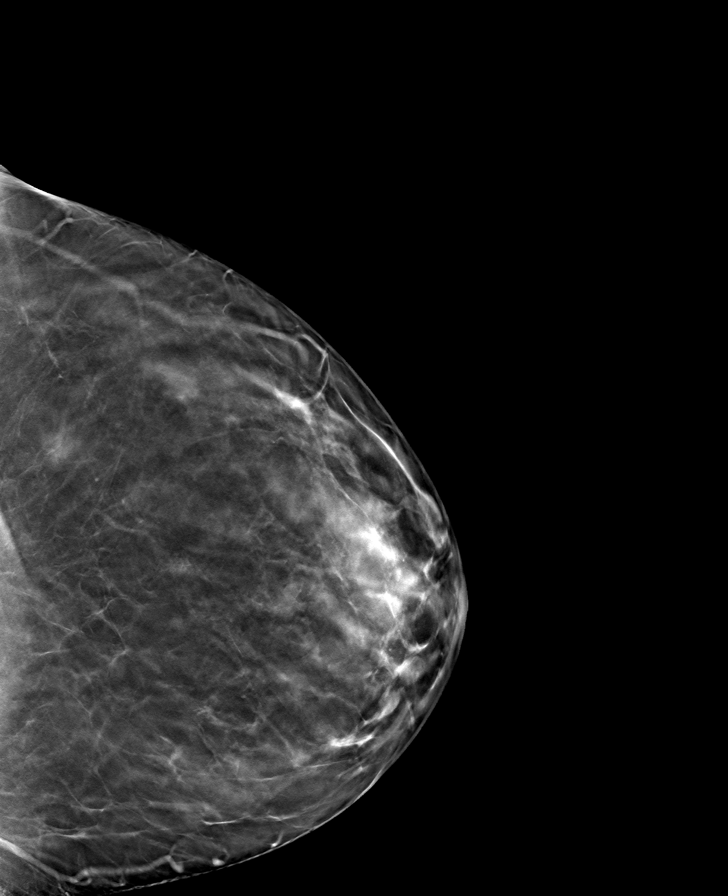

[8 of 40 positions shown; findings below may reference images not displayed]

ACR Breast Density Category c: The breast tissue is heterogeneously
dense, which may obscure small masses.
FINDINGS: Diagnostic mammographic images were obtained over the area of pain
in the LEFT breast. No suspicious mammographic finding is identified
in this area. A questioned asymmetry in the LEFT breast resolves
with additional views, consistent with overlapping tissue. No
suspicious mass, microcalcification, or other finding is identified
in either breast. No suspicious mammographic etiology for breast
pain identified.
IMPRESSION: 1. No suspicious mammographic etiology for LEFT breast pain is
identified. Any further workup of the patient's symptoms should be
based on the clinical assessment.
2. No mammographic evidence of malignancy bilaterally.

RECOMMENDATION:
Screening mammogram at age 38 unless there are persistent or
intervening clinical concerns. (Code:[id number]-[id number])

I have discussed the findings and recommendations with the patient.
If applicable, a reminder letter will be sent to the patient
regarding the next appointment.

BI-RADS CATEGORY  1: Negative.

## 2023-01-05 ENCOUNTER — Ambulatory Visit: Payer: BC Managed Care – PPO | Admitting: Physician Assistant

## 2023-01-05 NOTE — Progress Notes (Deleted)
      Established patient visit   Patient: Tyisha Cressy   DOB: 1991/05/18   32 y.o. Female  MRN: [id number] Visit Date: 01/05/2023  Today's healthcare provider: Mikey Kirschner, PA-C   No chief complaint on file.  Subjective    HPI  ***  Medications: Outpatient Medications Prior to Visit  Medication Sig   drospirenone-ethinyl estradiol (YAZ) 3-0.02 MG tablet Take 1 tablet by mouth daily.   levonorgestrel (MIRENA) 20 MCG/24HR IUD 1 each by Intrauterine route once.   tretinoin (RETIN-A) 0.05 % cream Apply topically at bedtime.   valACYclovir (VALTREX) 1000 MG tablet Take 1,000 mg by mouth 2 (two) times daily. As needed   No facility-administered medications prior to visit.    Review of Systems  {Labs  Heme  Chem  Endocrine  Serology  Results Review (optional):23779}   Objective    There were no vitals taken for this visit. {Show previous vital signs (optional):23777}  Physical Exam  ***  No results found for any visits on 01/05/23.  Assessment & Plan     ***  No follow-ups on file.      {provider attestation***:1}   Mikey Kirschner, PA-C  [facility] [phone] (phone) [phone] (fax)  [place]

## 2023-02-17 ENCOUNTER — Other Ambulatory Visit: Payer: Self-pay | Admitting: Physician Assistant

## 2023-02-21 MED ORDER — VALACYCLOVIR HCL 1 G PO TABS: 1000.0000 mg | ORAL_TABLET | Freq: Every day | ORAL | 0 refills | Status: AC

## 2023-05-08 ENCOUNTER — Telehealth: Payer: BC Managed Care – PPO | Admitting: Physician Assistant

## 2023-05-08 DIAGNOSIS — B001 Herpesviral vesicular dermatitis: Secondary | ICD-10-CM | POA: Diagnosis not present

## 2023-05-08 MED ORDER — VALACYCLOVIR HCL 1 G PO TABS: 2000.0000 mg | ORAL_TABLET | Freq: Two times a day (BID) | ORAL | 0 refills | Status: AC

## 2023-05-08 NOTE — Progress Notes (Signed)

## 2023-05-08 NOTE — Progress Notes (Signed)
I have spent 5 minutes in review of e-visit questionnaire, review and updating patient chart, medical decision making and response to patient.   Giorgio Chabot Cody Kayin Osment, PA-C    

## 2023-06-22 ENCOUNTER — Telehealth: Payer: BC Managed Care – PPO | Admitting: Physician Assistant

## 2023-06-22 DIAGNOSIS — T753XXA Motion sickness, initial encounter: Secondary | ICD-10-CM | POA: Diagnosis not present

## 2023-06-22 MED ORDER — SCOPOLAMINE 1 MG/3DAYS TD PT72: 1.0000 | MEDICATED_PATCH | TRANSDERMAL | 0 refills | Status: DC

## 2023-06-22 NOTE — Progress Notes (Signed)
I have spent 5 minutes in review of e-visit questionnaire, review and updating patient chart, medical decision making and response to patient.   William Cody Martin, PA-C    

## 2023-06-22 NOTE — Progress Notes (Signed)
E Visit for Motion Sickness  We are sorry that you are not feeling well. Here is how we plan to help!  Based on what you have shared with me it looks like you have symptoms of motion sickness.  I have prescribed a medication that will help prevent or alleviate your symptoms:  Scopolamine Transdermal 1 mg patch behind ear at least 4 hours prior to travel (preferably 12 hours)   Prevention:  You might feel better if you keep your eyes focused on outside while you are in motion. For example, if you are in a car, sit in the front and look in the direction you are moving; if you are on a boat, stay on the deck and look to the horizon. This helps make what you see match the movement you are feeling, and so you are less likely to feel sick.  You should also avoid reading, watching a movie, texting or reading messages, or looking at things close to you inside the vehicle you are riding in.  . Use the seat head rest. Lean your head against the back of the seat or head rest when traveling in vehicles with seats to minimize head movements.  . On a ship: When making your reservations, choose a cabin in the middle of the ship and near the waterline. When on board, go up on deck and focus on the horizon.  . In an airplane: Request a window seat and look out the window. A seat over the front edge of the wing is the most preferable spot (the degree of motion is the lowest here). Direct the air vent to blow cool air on your face.  . On a train: Always face forward and sit near a window.  . In a vehicle: Sit in the front seat; if you are the passenger, look at the scenery in the distance. For some people, driving the vehicle (rather than being a passenger) is an instant remedy.  . Avoid others who have become nauseous with motion sickness. Seeing and smelling others who have motion sickness may cause you to become sick.  GET HELP RIGHT AWAY IF:   Your symptoms do not improve or worsen within 2 days  after treatment.   You cannot keep down fluids after trying the medication.   Other associated symptoms such as severe headache, visual field changes, fever, or intractable nausea and vomiting.  MAKE SURE YOU:   Understand these instructions.  Will watch your condition.  Will get help right away if you are not doing well or get worse.  Thank you for choosing an e-visit.  Your e-visit answers were reviewed by a board certified advanced clinical practitioner to complete your personal care plan. Depending upon the condition, your plan could have included both over the counter or prescription medications.  Please review your pharmacy choice. Be sure that the pharmacy you have chosen is open so that you can pick up your prescription now.  If there is a problem you may message your provider in MyChart to have the prescription routed to another pharmacy.  Your safety is important to us. If you have drug allergies check your prescription carefully.   For the next 24 hours, you can use MyChart to ask questions about today's visit, request a non-urgent call back, or ask for a work or school excuse from your e-visit provider.  You will get an e-mail in the next two days asking about your experience. I hope that your e-visit has been   valuable and will speed your recovery.   References or for more information: https://wwwnc.cdc.gov/travel/yellowbook/2020/travel-by-air-land-sea/motion-sickness https://my.[facility].org/health/articles/12782-motion-sickness https://www.uptodate.com  

## 2023-07-13 ENCOUNTER — Encounter: Payer: Self-pay | Admitting: Physician Assistant

## 2023-08-24 ENCOUNTER — Encounter: Payer: BC Managed Care – PPO | Admitting: Physician Assistant

## 2023-08-28 ENCOUNTER — Telehealth: Payer: Self-pay | Admitting: Physician Assistant

## 2023-08-28 ENCOUNTER — Encounter: Payer: BC Managed Care – PPO | Admitting: Family Medicine

## 2023-08-31 NOTE — Telephone Encounter (Signed)
Opened in error

## 2023-09-05 ENCOUNTER — Encounter: Payer: Self-pay | Admitting: Family Medicine

## 2023-09-05 ENCOUNTER — Ambulatory Visit: Payer: BC Managed Care – PPO | Admitting: Family Medicine

## 2023-09-05 VITALS — BP 111/72 | HR 65 | Temp 98.0°F | Ht 64.0 in | Wt 211.0 lb

## 2023-09-05 DIAGNOSIS — M25562 Pain in left knee: Secondary | ICD-10-CM | POA: Diagnosis not present

## 2023-09-05 DIAGNOSIS — G43009 Migraine without aura, not intractable, without status migrainosus: Secondary | ICD-10-CM

## 2023-09-05 DIAGNOSIS — Z8619 Personal history of other infectious and parasitic diseases: Secondary | ICD-10-CM

## 2023-09-05 DIAGNOSIS — M25462 Effusion, left knee: Secondary | ICD-10-CM | POA: Insufficient documentation

## 2023-09-05 DIAGNOSIS — Z13220 Encounter for screening for lipoid disorders: Secondary | ICD-10-CM

## 2023-09-05 DIAGNOSIS — Z30433 Encounter for removal and reinsertion of intrauterine contraceptive device: Secondary | ICD-10-CM

## 2023-09-05 DIAGNOSIS — Z6836 Body mass index (BMI) 36.0-36.9, adult: Secondary | ICD-10-CM

## 2023-09-05 DIAGNOSIS — Z131 Encounter for screening for diabetes mellitus: Secondary | ICD-10-CM

## 2023-09-05 DIAGNOSIS — Z0000 Encounter for general adult medical examination without abnormal findings: Secondary | ICD-10-CM

## 2023-09-05 DIAGNOSIS — Z114 Encounter for screening for human immunodeficiency virus [HIV]: Secondary | ICD-10-CM

## 2023-09-05 DIAGNOSIS — E669 Obesity, unspecified: Secondary | ICD-10-CM

## 2023-09-05 DIAGNOSIS — Z7689 Persons encountering health services in other specified circumstances: Secondary | ICD-10-CM

## 2023-09-05 DIAGNOSIS — Z1159 Encounter for screening for other viral diseases: Secondary | ICD-10-CM

## 2023-09-05 MED ORDER — PHENTERMINE HCL 37.5 MG PO CAPS
37.5000 mg | ORAL_CAPSULE | ORAL | 0 refills | Status: DC
Start: 2023-09-05 — End: 2023-10-11

## 2023-09-05 MED ORDER — SUMATRIPTAN SUCCINATE 25 MG PO TABS: 25.0000 mg | ORAL_TABLET | ORAL | 0 refills | Status: DC | PRN

## 2023-09-05 NOTE — Progress Notes (Unsigned)
Complete physical exam   Patient: Savannah Callahan   DOB: [birth year]   32 y.o. Female  MRN: [id number] Visit Date: 09/05/2023  Today's healthcare provider: Ronnald Ramp, MD   Chief Complaint  Patient presents with   Annual Exam   Knee Pain    Left knee pain   Migraine    Patient reports that her migraines are becoming a little more frequent   Subjective    Savannah Callahan is a 32 y.o. female who presents today for a complete physical exam.   She reports consuming a  keto  diet.    Walking 3 miles     She generally feels well.   She reports sleeping well.    She does have additional problems to discuss today.   Discussed the use of AI scribe software for clinical note transcription with the patient, who gave verbal consent to proceed.  History of Present Illness   The patient, a pharmacist, presents for an annual physical and to discuss concerns about migraines, knee pain, and weight management. She also mentions the need for an IUD replacement, as it has been five years since the Mirena IUD was inserted.  The patient reports a long-standing history of migraines, which have increased in frequency over the past year from four or five times a year to once or twice a month. The migraines are typically unilateral, with no aura, and occasionally associated with light sensitivity and vomiting. The patient also notes an unusual symptom of gum swelling on the same side as the migraine. She has previously managed the migraines with over-the-counter medications but is now seeking a prescription for a rescue medication, specifically Sumatriptan.  Regarding the knee pain, the patient reports a two-year history of discomfort in the left knee, particularly after playing softball. She describes a knot on the top of the knee and soreness after sitting for extended periods. The pain is managed with ibuprofen, but the patient is considering seeking orthopedic  consultation.  The patient also discusses her current diet, which is a ketogenic diet with less than thirty carbs per day. She expresses interest in a prescription for phentermine for weight management, having used it successfully in the past. She has previously tried a different weight loss medication, but it was not covered by her insurance.  Lastly, the patient mentions a history of a positive HPV Pap smear and the need for a replacement of her IUD, which she plans to address during a future gynecology appointment.         Past Medical History:  Diagnosis Date   Depression    Past Surgical History:  Procedure Laterality Date   CESAREAN SECTION N/A 12/22/2016   Procedure: CESAREAN SECTION;  Surgeon: Nadara Mustard, MD;  Location: [facility] ORS;  Service: Obstetrics;  Laterality: N/A;   Social History   Socioeconomic History   Marital status: Divorced    Spouse name: Not on file   Number of children: 1   Years of education: Not on file   Highest education level: Not on file  Occupational History   Not on file  Tobacco Use   Smoking status: Never   Smokeless tobacco: Never  Vaping Use   Vaping status: Never Used  Substance and Sexual Activity   Alcohol use: Yes    Alcohol/week: 1.0 standard drink of alcohol    Types: 1 Glasses of wine per week    Comment: occassionally   Drug use: No   Sexual activity: Yes  Birth control/protection: I.U.D.  Other Topics Concern   Not on file  Social History Narrative   Not on file   Social Determinants of Health   Financial Resource Strain: Not on file  Food Insecurity: Not on file  Transportation Needs: Not on file  Physical Activity: Not on file  Stress: Not on file  Social Connections: Not on file  Intimate Partner Violence: Unknown (12/09/2021)   Humiliation, Afraid, Rape, and Kick questionnaire    Fear of Current or Ex-Partner: No    Emotionally Abused: No    Physically Abused: No    Sexually Abused: Not on file   Family  Status  Relation Name Status   Mother  Alive   Father  Alive   Brother  Alive   MGM  Alive   MGF  Alive   PGM  Alive   PGF  Deceased       MVA  No partnership data on file   Family History  Problem Relation Age of Onset   Stroke Maternal Grandmother    No Known Allergies   Medications: Outpatient Medications Prior to Visit  Medication Sig   levonorgestrel (MIRENA) 20 MCG/24HR IUD 1 each by Intrauterine route once.   spironolactone (ALDACTONE) 100 MG tablet Take 100 mg by mouth daily.   tretinoin (RETIN-A) 0.05 % cream Apply topically at bedtime.   [DISCONTINUED] drospirenone-ethinyl estradiol (YAZ) 3-0.02 MG tablet Take 1 tablet by mouth daily.   [DISCONTINUED] scopolamine (TRANSDERM-SCOP) 1 MG/3DAYS Place 1 patch (1.5 mg total) onto the skin every 3 (three) days.   No facility-administered medications prior to visit.    Review of Systems     Objective    BP 111/72 (BP Location: Right Arm, Patient Position: Sitting, Cuff Size: Normal)   Pulse 65   Temp 98 F (36.7 C) (Oral)   Ht 5\' 4"  (1.626 m)   Wt 211 lb (95.7 kg)   SpO2 100%   BMI 36.22 kg/m   BP Readings from Last 3 Encounters:  09/05/23 111/72  08/23/22 (!) 110/54  04/13/22 105/64   Wt Readings from Last 3 Encounters:  09/05/23 211 lb (95.7 kg)  08/23/22 206 lb 12.8 oz (93.8 kg)  04/13/22 215 lb (97.5 kg)        Physical Exam Vitals reviewed.  Constitutional:      General: She is not in acute distress.    Appearance: Normal appearance. She is not ill-appearing, toxic-appearing or diaphoretic.  HENT:     Head: Normocephalic and atraumatic.     Right Ear: Tympanic membrane and external ear normal. There is no impacted cerumen.     Left Ear: Tympanic membrane and external ear normal. There is no impacted cerumen.     Nose: Nose normal.     Mouth/Throat:     Pharynx: Oropharynx is clear.  Eyes:     General: No scleral icterus.    Extraocular Movements: Extraocular movements intact.      Conjunctiva/sclera: Conjunctivae normal.     Pupils: Pupils are equal, round, and reactive to light.  Cardiovascular:     Rate and Rhythm: Normal rate and regular rhythm.     Pulses: Normal pulses.     Heart sounds: Normal heart sounds. No murmur heard.    No friction rub. No gallop.  Pulmonary:     Effort: Pulmonary effort is normal. No respiratory distress.     Breath sounds: Normal breath sounds. No wheezing, rhonchi or rales.  Abdominal:     General:  Bowel sounds are normal. There is no distension.     Palpations: Abdomen is soft. There is no mass.     Tenderness: There is no abdominal tenderness. There is no guarding.  Musculoskeletal:        General: No deformity.     Cervical back: Normal range of motion and neck supple. No rigidity.     Right lower leg: No edema.     Left lower leg: No edema.  Lymphadenopathy:     Cervical: No cervical adenopathy.  Skin:    General: Skin is warm.     Capillary Refill: Capillary refill takes less than 2 seconds.     Findings: No erythema or rash.  Neurological:     General: No focal deficit present.     Mental Status: She is alert and oriented to person, place, and time.     Motor: No weakness.     Gait: Gait normal.  Psychiatric:        Mood and Affect: Mood normal.        Behavior: Behavior normal.       Last depression screening scores    09/05/2023    1:53 PM 01/11/2022    9:53 AM 08/10/2020    2:17 PM  PHQ 2/9 Scores  PHQ - 2 Score 0 0 0  PHQ- 9 Score 0 2 1    Last fall risk screening    09/05/2023    1:53 PM  Fall Risk   Falls in the past year? 0  Number falls in past yr: 0  Injury with Fall? 0    Last Audit-C alcohol use screening    09/05/2023    1:54 PM  Alcohol Use Disorder Test (AUDIT)  1. How often do you have a drink containing alcohol? 1  2. How many drinks containing alcohol do you have on a typical day when you are drinking? 0  3. How often do you have six or more drinks on one occasion? 0   AUDIT-C Score 1   A score of 3 or more in women, and 4 or more in men indicates increased risk for alcohol abuse, EXCEPT if all of the points are from question 1   Results for orders placed or performed in visit on 09/05/23  Hemoglobin A1c  Result Value Ref Range   Hgb A1c MFr Bld 5.3 4.8 - 5.6 %   Est. average glucose Bld gHb Est-mCnc 105 mg/dL  CBC  Result Value Ref Range   WBC 9.3 3.4 - 10.8 x10E3/uL   RBC 4.38 3.77 - 5.28 x10E6/uL   Hemoglobin 13.4 11.1 - 15.9 g/dL   Hematocrit 16.1 09.6 - 46.6 %   MCV 92 79 - 97 fL   MCH 30.6 26.6 - 33.0 pg   MCHC 33.2 31.5 - 35.7 g/dL   RDW 04.5 40.9 - 81.1 %   Platelets 227 150 - 450 x10E3/uL  CMP14+EGFR  Result Value Ref Range   Glucose 83 70 - 99 mg/dL   BUN 16 6 - 20 mg/dL   Creatinine, Ser 9.14 0.57 - 1.00 mg/dL   eGFR 85 >78 GN/FAO/1.30   BUN/Creatinine Ratio 18 9 - 23   Sodium 141 134 - 144 mmol/L   Potassium 4.5 3.5 - 5.2 mmol/L   Chloride 105 96 - 106 mmol/L   CO2 22 20 - 29 mmol/L   Calcium 9.5 8.7 - 10.2 mg/dL   Total Protein 7.1 6.0 - 8.5 g/dL   Albumin 4.6 3.9 -  4.9 g/dL   Globulin, Total 2.5 1.5 - 4.5 g/dL   Bilirubin Total 0.4 0.0 - 1.2 mg/dL   Alkaline Phosphatase 38 (L) 44 - 121 IU/L   AST 20 0 - 40 IU/L   ALT 20 0 - 32 IU/L  Lipid panel  Result Value Ref Range   Cholesterol, Total 169 100 - 199 mg/dL   Triglycerides 48 0 - 149 mg/dL   HDL 69 >16 mg/dL   VLDL Cholesterol Cal 10 5 - 40 mg/dL   LDL Chol Calc (NIH) 90 0 - 99 mg/dL   Chol/HDL Ratio 2.4 0.0 - 4.4 ratio  HIV Antibody (routine testing w rflx)  Result Value Ref Range   HIV Screen 4th Generation wRfx Non Reactive Non Reactive  Hepatitis C antibody  Result Value Ref Range   Hep C Virus Ab Non Reactive Non Reactive  TSH+T4F+T3Free  Result Value Ref Range   TSH 1.400 0.450 - 4.500 uIU/mL   T3, Free 3.3 2.0 - 4.4 pg/mL   Free T4 1.27 0.82 - 1.77 ng/dL    Assessment & Plan    Routine Health Maintenance and Physical Exam  Immunization History   Administered Date(s) Administered   DTaP 05/26/1989, 08/30/1989, 12/05/1989, 09/21/1990, 03/11/1994   HIB (PRP-OMP) 01/05/1990, 03/14/1990, 09/21/1990   Hepatitis B 01/15/1994, 03/11/1994, 09/09/1994   Hepatitis B, PED/ADOLESCENT 01/06/2023   Hepb-cpg 06/06/2023   Hpv-Unspecified 06/30/2008, 09/12/2008, 02/19/2009   IPV 08/30/1989, 12/05/1989, 09/21/1990, 02/19/1994   Influenza Inj Mdck Quad Pf 08/08/2018, 07/27/2019, 08/31/2021, 07/13/2022   MMR 07/03/1990, 03/11/1994   Meningococcal Conjugate 05/09/2007   Td 06/24/2004   Tdap 01/16/2019    Health Maintenance  Topic Date Due   Cervical Cancer Screening (Pap smear)  01/11/2023   INFLUENZA VACCINE  06/22/2023   COVID-19 Vaccine (1 - 2023-24 season) Never done   DTaP/Tdap/Td (8 - Td or Tdap) 01/16/2029   HPV VACCINES  Completed   Hepatitis C Screening  Completed   HIV Screening  Completed    Problem List Items Addressed This Visit     Annual physical exam - Primary    Chronic conditions are stable  Patient was counseled on benefits of regular physical activity with goal of 150 minutes of moderate to vigurous intensity 4 days per week  Patient was counseled to consume well balanced diet of fruits, vegetables, limited saturated fats and limited sugary foods and beverages with emphasis on consuming 6-8 glasses of water daily  Screening recommended today: A1c, lipids,Hep C,HIV Vaccines recommended today: COVID       History of HPV infection   Migraine without aura and without status migrainosus, not intractable    Increased frequency from 4-5 times per year to 1-2 times per month. No aura, unilateral location, sometimes associated with gum swelling, light sensitivity, and occasional vomiting. No prior use of triptans. -Start Sumatriptan 25mg  as a rescue medication. -Consider newer medications like Ubrelvy or Nurtec if Sumatriptan is ineffective.      Relevant Medications   spironolactone (ALDACTONE) 100 MG tablet    SUMAtriptan (IMITREX) 25 MG tablet   Other Relevant Orders   CBC (Completed)   CMP14+EGFR (Completed)   Obesity (BMI 35.0-39.9 without comorbidity)    Request for Phentermine for weight loss. Prior successful use in 2019. -Start Phentermine 37.5mg  daily. -Follow-up in 1 month to assess response and tolerance. -CMP,TSH ordered as well as A1c        Relevant Medications   phentermine 37.5 MG capsule   Other Relevant Orders  Hemoglobin A1c (Completed)   CMP14+EGFR (Completed)   TSH+T4F+T3Free (Completed)   Pain and swelling of left knee    Chronic pain for approximately 2 years, worsened after physical activity. Noted a nodule on the patella. No prior injuries or surgeries. -Order knee x-ray to further evaluate the nodule. -Continue ibuprofen as needed for pain. -Consider referral to physical therapy.      Relevant Orders   DG Knee AP/LAT W/Sunrise Left   Other Visit Diagnoses     Encounter for removal and reinsertion of intrauterine contraceptive device (IUD)       Relevant Orders   Ambulatory referral to Obstetrics / Gynecology   Screening for lipid disorders       Relevant Orders   Lipid panel (Completed)   Encounter for hepatitis C screening test for low risk patient       Relevant Orders   Hepatitis C antibody (Completed)   Screening for diabetes mellitus       Relevant Orders   Hemoglobin A1c (Completed)   Screening for HIV (human immunodeficiency virus)       Relevant Orders   HIV Antibody (routine testing w rflx) (Completed)         Contraception Mirena IUD in place for 5 years as of April 2024. -Refer to gynecology for IUD replacement.  General Health Maintenance -Order labs including A1C, CBC, CMP, lipid panel, Hepatitis C and HIV screening, and thyroid function tests. -Plan Pap smear during IUD replacement visit.       Return in about 1 month (around 10/06/2023) for Weight MGMT.       Ronnald Ramp, MD  [facility] [phone] (phone) [phone] (fax)  [facility]

## 2023-09-05 NOTE — Patient Instructions (Addendum)
It was a pleasure to see you today!  Thank you for choosing [facility] for your primary care.   Today you were seen for your annual physical   Please review the attached information regarding helpful preventive health topics.   To keep you healthy, please keep in mind the following health maintenance items that you are due for:   Flu vaccine COVID vaccine    Please make sure to schedule your next annual physical for one year from today.   VISIT SUMMARY:  During your recent visit, we discussed your concerns about migraines, knee pain, weight management, and the need for an IUD replacement. We have developed a plan to address each of these issues.  YOUR PLAN:  -MIGRAINES: Your migraines have increased in frequency. We will start you on Sumatriptan 25mg  as a rescue medication. This is a drug that can help reduce the symptoms of a migraine when it occurs. If Sumatriptan is not effective, we may consider newer medications like Ubrelvy or Nurtec.  -LEFT KNEE PAIN: You have been experiencing chronic pain in your left knee, particularly after physical activity. We will order a knee x-ray to further evaluate the nodule you mentioned. In the meantime, continue taking ibuprofen as needed for pain. We may also consider referring you to physical therapy.  Please report to [facility] located at:  [address]  [place], [place] [address]  You do not need an appointment to have xrays completed.   Our office will follow up with  results once available.    -CONTRACEPTION: Your Mirena IUD has been in place for 5 years and needs to be replaced. We will refer you to a gynecologist for this procedure.  -WEIGHT MANAGEMENT: You have requested Phentermine for weight loss, which you have used successfully in the past. We will start you on Phentermine 37.5mg  daily and follow up in 1 month to assess your response and tolerance to the  medication.  -GENERAL HEALTH MAINTENANCE: We will order routine labs including A1C, CBC, CMP, lipid panel, Hepatitis C and HIV screening, and thyroid function tests. We also plan to perform a Pap smear during your IUD replacement visit.  INSTRUCTIONS:  Please schedule your follow-up appointment in one month to assess your response and tolerance to Phentermine. Also, please schedule an appointment with a gynecologist for your IUD replacement. We will also be ordering a knee x-ray and routine labs, so please make arrangements for these tests.   Best Wishes,   Dr. Roxan Hockey

## 2023-09-06 DIAGNOSIS — Z0000 Encounter for general adult medical examination without abnormal findings: Secondary | ICD-10-CM | POA: Insufficient documentation

## 2023-09-06 LAB — CMP14+EGFR
ALT: 20 [IU]/L (ref 0–32)
AST: 20 [IU]/L (ref 0–40)
Albumin: 4.6 g/dL (ref 3.9–4.9)
Alkaline Phosphatase: 38 [IU]/L — ABNORMAL LOW (ref 44–121)
BUN/Creatinine Ratio: 18 (ref 9–23)
BUN: 16 mg/dL (ref 6–20)
Bilirubin Total: 0.4 mg/dL (ref 0.0–1.2)
CO2: 22 mmol/L (ref 20–29)
Calcium: 9.5 mg/dL (ref 8.7–10.2)
Chloride: 105 mmol/L (ref 96–106)
Creatinine, Ser: 0.91 mg/dL (ref 0.57–1.00)
Globulin, Total: 2.5 g/dL (ref 1.5–4.5)
Glucose: 83 mg/dL (ref 70–99)
Potassium: 4.5 mmol/L (ref 3.5–5.2)
Sodium: 141 mmol/L (ref 134–144)
Total Protein: 7.1 g/dL (ref 6.0–8.5)
eGFR: 85 mL/min/{1.73_m2} (ref >59)

## 2023-09-06 LAB — TSH+T4F+T3FREE
Free T4: 1.27 ng/dL (ref 0.82–1.77)
T3, Free: 3.3 pg/mL (ref 2.0–4.4)
TSH: 1.4 u[IU]/mL (ref 0.450–4.500)

## 2023-09-06 LAB — HIV ANTIBODY (ROUTINE TESTING W REFLEX): HIV Screen 4th Generation wRfx: NONREACTIVE

## 2023-09-06 LAB — LIPID PANEL
Chol/HDL Ratio: 2.4 {ratio} (ref 0.0–4.4)
Cholesterol, Total: 169 mg/dL (ref 100–199)
HDL: 69 mg/dL (ref >39)
LDL Chol Calc (NIH): 90 mg/dL (ref 0–99)
Triglycerides: 48 mg/dL (ref 0–149)
VLDL Cholesterol Cal: 10 mg/dL (ref 5–40)

## 2023-09-06 LAB — CBC
Hematocrit: 40.4 % (ref 34.0–46.6)
Hemoglobin: 13.4 g/dL (ref 11.1–15.9)
MCH: 30.6 pg (ref 26.6–33.0)
MCHC: 33.2 g/dL (ref 31.5–35.7)
MCV: 92 fL (ref 79–97)
Platelets: 227 10*3/uL (ref 150–450)
RBC: 4.38 x10E6/uL (ref 3.77–5.28)
RDW: 12.1 % (ref 11.7–15.4)
WBC: 9.3 10*3/uL (ref 3.4–10.8)

## 2023-09-06 LAB — HEMOGLOBIN A1C
Est. average glucose Bld gHb Est-mCnc: 105 mg/dL
Hgb A1c MFr Bld: 5.3 % (ref 4.8–5.6)

## 2023-09-06 LAB — HEPATITIS C ANTIBODY: Hep C Virus Ab: NONREACTIVE

## 2023-09-06 NOTE — Assessment & Plan Note (Signed)
Request for Phentermine for weight loss. Prior successful use in 2019. -Start Phentermine 37.5mg  daily. -Follow-up in 1 month to assess response and tolerance. -CMP,TSH ordered as well as A1c

## 2023-09-06 NOTE — Assessment & Plan Note (Signed)
Chronic pain for approximately 2 years, worsened after physical activity. Noted a nodule on the patella. No prior injuries or surgeries. -Order knee x-ray to further evaluate the nodule. -Continue ibuprofen as needed for pain. -Consider referral to physical therapy.

## 2023-09-06 NOTE — Assessment & Plan Note (Signed)
Increased frequency from 4-5 times per year to 1-2 times per month. No aura, unilateral location, sometimes associated with gum swelling, light sensitivity, and occasional vomiting. No prior use of triptans. -Start Sumatriptan 25mg  as a rescue medication. -Consider newer medications like Ubrelvy or Nurtec if Sumatriptan is ineffective.

## 2023-09-06 NOTE — Assessment & Plan Note (Signed)
Chronic conditions are stable  Patient was counseled on benefits of regular physical activity with goal of 150 minutes of moderate to vigurous intensity 4 days per week  Patient was counseled to consume well balanced diet of fruits, vegetables, limited saturated fats and limited sugary foods and beverages with emphasis on consuming 6-8 glasses of water daily  Screening recommended today: A1c, lipids,Hep C,HIV Vaccines recommended today: COVID

## 2023-10-10 NOTE — Progress Notes (Unsigned)
      Established patient visit   Patient: Savannah Callahan   DOB: 1991/09/05   32 y.o. Female  MRN: [id number] Visit Date: 10/11/2023  Today's healthcare provider: Ronnald Ramp, MD   No chief complaint on file.  Subjective       Discussed the use of AI scribe software for clinical note transcription with the patient, who gave verbal consent to proceed.  History of Present Illness             Past Medical History:  Diagnosis Date   Depression     Medications: Outpatient Medications Prior to Visit  Medication Sig   levonorgestrel (MIRENA) 20 MCG/24HR IUD 1 each by Intrauterine route once.   phentermine 37.5 MG capsule Take 1 capsule (37.5 mg total) by mouth every morning.   spironolactone (ALDACTONE) 100 MG tablet Take 100 mg by mouth daily.   SUMAtriptan (IMITREX) 25 MG tablet Take 1 tablet (25 mg total) by mouth every 2 (two) hours as needed for migraine. May repeat in 2 hours if headache persists or recurs.   tretinoin (RETIN-A) 0.05 % cream Apply topically at bedtime.   No facility-administered medications prior to visit.    Review of Systems  {Insert previous labs (optional):23779} {See past labs  Heme  Chem  Endocrine  Serology  Results Review (optional):1}   Objective    There were no vitals taken for this visit. {Insert last BP/Wt (optional):23777}{See vitals history (optional):1}      Physical Exam  ***  No results found for any visits on 10/11/23.  Assessment & Plan     Problem List Items Addressed This Visit   None   Assessment and Plan              No follow-ups on file.         Ronnald Ramp, MD  [facility] [phone] (phone) [phone] (fax)  [facility]

## 2023-10-11 ENCOUNTER — Ambulatory Visit: Payer: BC Managed Care – PPO | Admitting: Family Medicine

## 2023-10-11 ENCOUNTER — Encounter: Payer: Self-pay | Admitting: Family Medicine

## 2023-10-11 VITALS — BP 104/80 | HR 73 | Resp 100 | Ht 64.0 in | Wt 203.4 lb

## 2023-10-11 DIAGNOSIS — E669 Obesity, unspecified: Secondary | ICD-10-CM

## 2023-10-11 DIAGNOSIS — Z8619 Personal history of other infectious and parasitic diseases: Secondary | ICD-10-CM | POA: Diagnosis not present

## 2023-10-11 DIAGNOSIS — Z6834 Body mass index (BMI) 34.0-34.9, adult: Secondary | ICD-10-CM | POA: Diagnosis not present

## 2023-10-11 MED ORDER — PHENTERMINE HCL 37.5 MG PO CAPS: 37.5000 mg | ORAL_CAPSULE | ORAL | 0 refills | Status: AC

## 2023-10-11 MED ORDER — PHENTERMINE HCL 37.5 MG PO CAPS: 37.5000 mg | ORAL_CAPSULE | ORAL | 0 refills | Status: DC

## 2023-10-11 NOTE — Assessment & Plan Note (Signed)
Positive HPV result in 2021. Last Pap smear in 2022 was unsatisfactory. Advised to schedule a Pap smear with HPV testing due to history of positive HPV. Emphasized the importance of regular screening for accurate testing. - Schedule Pap smear with HPV testing

## 2023-10-11 NOTE — Assessment & Plan Note (Addendum)
32 year old with chronic, improving obesity, currently on phentermine 37.5 mg daily since September 05, 2023. Lost 8 pounds (211 to 203 pounds). Blood pressure and heart rate within normal range. No reported side effects. Discussed continuing phentermine for two more months to complete a three-month course. Emphasized increasing exercise, especially during the holiday season, to maintain weight loss. Informed about potential side effects and the need to report any such as palpitations or headaches. - Continue phentermine 37.5 mg daily refills provided for two more months - Increase exercise regimen - Monitor and report any side effects such as palpitations or headaches - plan to DC phentermine after total of 3 months of therapy

## 2023-11-03 ENCOUNTER — Ambulatory Visit: Payer: BC Managed Care – PPO | Admitting: Family Medicine

## 2023-11-18 ENCOUNTER — Ambulatory Visit
Admission: EM | Admit: 2023-11-18 | Discharge: 2023-11-18 | Disposition: A | Discharge status: Home / Self Care | Payer: BC Managed Care – PPO | Attending: Emergency Medicine | Admitting: Emergency Medicine

## 2023-11-18 ENCOUNTER — Encounter: Payer: Self-pay | Admitting: Emergency Medicine

## 2023-11-18 DIAGNOSIS — U071 COVID-19: Secondary | ICD-10-CM | POA: Diagnosis present

## 2023-11-18 LAB — SARS CORONAVIRUS 2 BY RT PCR: SARS Coronavirus 2 by RT PCR: POSITIVE — AB

## 2023-11-18 MED ORDER — BENZONATATE 100 MG PO CAPS: 200.0000 mg | ORAL_CAPSULE | Freq: Three times a day (TID) | ORAL | 0 refills | Status: DC

## 2023-11-18 MED ORDER — IPRATROPIUM BROMIDE 0.06 % NA SOLN: 2.0000 | Freq: Four times a day (QID) | NASAL | 12 refills | Status: DC

## 2023-11-18 MED ORDER — PROMETHAZINE-DM 6.25-15 MG/5ML PO SYRP: 5.0000 mL | ORAL_SOLUTION | Freq: Four times a day (QID) | ORAL | 0 refills | Status: DC | PRN

## 2023-11-18 NOTE — Discharge Instructions (Addendum)
CDC guidelines state that you must wear a mask for the first 5 days of symptoms when you are around other people.  After 5 days you no longer need to mask as you are no longer considered infectious.  There is no longer need to quarantine unless you have a fever.  If you do have a fever then you need to quarantine until you have been fever free for 24 hours without taking Tylenol and/or ibuprofen.  Use over-the-counter Tylenol and/or ibuprofen according to the package instructions as needed for fever and pain.  Use the Atrovent nasal spray, 2 squirts up each nostril every 6 hours, as needed for nasal congestion and runny nose.  Use the Tessalon Perles every 8 hours during the day as needed for cough.  Take them with a small sip of water.  You may experience numbness to the base of your tongue or metallic taste in her mouth, this is normal.  Use the Promethazine DM cough syrup at bedtime as needed for cough and congestion.  Be mindful this medication will make you sleepy.  If you develop any worsening respiratory symptoms such as shortness of breath, shortness of breath at rest, feel as though you cannot catch your breath, you are unable to speak in full sentences, or, as a late sign, your lips begin turning blue you need to call 911 and go to the ER for evaluation.

## 2023-11-18 NOTE — ED Triage Notes (Signed)
Patient c/o cough, sinus congestion and pressure, and nasal congestion that started on Thursday.  Patient had a home covid test positive yesterday.

## 2023-11-18 NOTE — ED Provider Notes (Signed)
[facility]    CSN: [id number] Arrival date & time: 11/18/23  0900      History   Chief Complaint Chief Complaint  Patient presents with   Sinus Problem   Cough   Nasal Congestion    HPI Savannah Callahan is a 32 y.o. female.   HPI  32 year old female with a past medical history significant for migraines and obesity presents for evaluation of respiratory symptoms that started 2 days ago.  The patient reports that she took a home COVID test yesterday that was positive.  She is endorsing nasal congestion with significant sinus pressure and pain around her eyes, headache, and a nonproductive cough.  No fever, sore throat, ear pain, shortness breath, wheezing, GI complaints, or bodyaches.  She did travel to [place] over the Christmas holiday.  Past Medical History:  Diagnosis Date   Depression     Patient Active Problem List   Diagnosis Date Noted   Annual physical exam 09/06/2023   Obesity (BMI 35.0-39.9 without comorbidity) 09/05/2023   Migraine without aura and without status migrainosus, not intractable 09/05/2023   Pain and swelling of left knee 09/05/2023   Obesity (BMI 30-39.9) 01/11/2022   History of cold sores 09/07/2021   Screening for cervical cancer 09/07/2021   History of HPV infection 09/07/2021   Weight gain 09/07/2021    Past Surgical History:  Procedure Laterality Date   CESAREAN SECTION N/A 12/22/2016   Procedure: CESAREAN SECTION;  Surgeon: Nadara Mustard, MD;  Location: [facility] ORS;  Service: Obstetrics;  Laterality: N/A;    OB History     Gravida  1   Para      Term      Preterm      AB      Living         SAB      IAB      Ectopic      Multiple      Live Births               Home Medications    Prior to Admission medications   Medication Sig Start Date End Date Taking? Authorizing Provider  benzonatate (TESSALON) 100 MG capsule Take 2 capsules (200 mg total) by mouth every 8 (eight) hours. 11/18/23  Yes  Becky Augusta, NP  ipratropium (ATROVENT) 0.06 % nasal spray Place 2 sprays into both nostrils 4 (four) times daily. 11/18/23  Yes Becky Augusta, NP  levonorgestrel (MIRENA) 20 MCG/24HR IUD 1 each by Intrauterine route once.   Yes [provider]  phentermine 37.5 MG capsule Take 1 capsule (37.5 mg total) by mouth every morning. 11/08/23  Yes Simmons-Robinson, Makiera, MD  promethazine-dextromethorphan (PROMETHAZINE-DM) 6.25-15 MG/5ML syrup Take 5 mLs by mouth 4 (four) times daily as needed. 11/18/23  Yes Becky Augusta, NP  spironolactone (ALDACTONE) 100 MG tablet Take 100 mg by mouth daily.    [provider]  SUMAtriptan (IMITREX) 25 MG tablet Take 1 tablet (25 mg total) by mouth every 2 (two) hours as needed for migraine. May repeat in 2 hours if headache persists or recurs. 09/05/23   Simmons-Robinson, Makiera, MD  tretinoin (RETIN-A) 0.05 % cream Apply topically at bedtime. 11/24/22   Margaretann Loveless, PA-C    Family History Family History  Problem Relation Age of Onset   Stroke Maternal Grandmother     Social History Social History   Tobacco Use   Smoking status: Never   Smokeless tobacco: Never  Vaping Use  Vaping status: Never Used  Substance Use Topics   Alcohol use: Yes    Alcohol/week: 1.0 standard drink of alcohol    Types: 1 Glasses of wine per week    Comment: occassionally   Drug use: No     Allergies   Patient has no known allergies.   Review of Systems Review of Systems  Constitutional:  Negative for fever.  HENT:  Positive for congestion, rhinorrhea and sinus pain. Negative for ear pain and sore throat.   Respiratory:  Positive for cough. Negative for shortness of breath and wheezing.   Gastrointestinal:  Negative for diarrhea, nausea and vomiting.  Musculoskeletal:  Negative for arthralgias and myalgias.  Neurological:  Positive for headaches.     Physical Exam Triage Vital Signs ED Triage Vitals [11/18/23 1027]  Encounter Vitals  Group     BP      Systolic BP Percentile      Diastolic BP Percentile      Pulse      Resp      Temp      Temp src      SpO2      Weight 205 lb (93 kg)     Height 5\' 4"  (1.626 m)     Head Circumference      Peak Flow      Pain Score 6     Pain Loc      Pain Education      Exclude from Growth Chart    No data found.  Updated Vital Signs BP 116/68 (BP Location: Left Arm)   Pulse 80   Temp 98.5 F (36.9 C) (Oral)   Resp 14   Ht 5\' 4"  (1.626 m)   Wt 205 lb (93 kg)   SpO2 100%   BMI 35.19 kg/m   Visual Acuity Right Eye Distance:   Left Eye Distance:   Bilateral Distance:    Right Eye Near:   Left Eye Near:    Bilateral Near:     Physical Exam Vitals and nursing note reviewed.  Constitutional:      Appearance: Normal appearance. She is not ill-appearing.  HENT:     Head: Normocephalic and atraumatic.     Right Ear: Tympanic membrane, ear canal and external ear normal. There is no impacted cerumen.     Left Ear: Tympanic membrane, ear canal and external ear normal. There is no impacted cerumen.     Nose: Congestion and rhinorrhea present.     Comments: Nasal mucosa is erythematous and edematous with clear discharge in both nares.    Mouth/Throat:     Mouth: Mucous membranes are moist.     Pharynx: Oropharynx is clear. No oropharyngeal exudate or posterior oropharyngeal erythema.  Cardiovascular:     Rate and Rhythm: Normal rate and regular rhythm.     Pulses: Normal pulses.     Heart sounds: Normal heart sounds. No murmur heard.    No friction rub. No gallop.  Pulmonary:     Effort: Pulmonary effort is normal.     Breath sounds: Normal breath sounds. No wheezing, rhonchi or rales.  Musculoskeletal:     Cervical back: Normal range of motion and neck supple. No tenderness.  Lymphadenopathy:     Cervical: No cervical adenopathy.  Skin:    General: Skin is warm and dry.     Capillary Refill: Capillary refill takes less than 2 seconds.     Findings: No rash.   Neurological:  General: No focal deficit present.     Mental Status: She is alert and oriented to person, place, and time.      UC Treatments / Results  Labs (all labs ordered are listed, but only abnormal results are displayed) Labs Reviewed  SARS CORONAVIRUS 2 BY RT PCR - Abnormal; Notable for the following components:      Result Value   SARS Coronavirus 2 by RT PCR POSITIVE (*)    All other components within normal limits    EKG   Radiology No results found.  Procedures Procedures (including critical care time)  Medications Ordered in UC Medications - No data to display  Initial Impression / Assessment and Plan / UC Course  I have reviewed the triage vital signs and the nursing notes.  Pertinent labs & imaging results that were available during my care of the patient were reviewed by me and considered in my medical decision making (see chart for details).   Patient is a nontoxic-appearing 32 year old female presenting for evaluation of respiratory symptoms in the setting of testing positive for COVID at home.  Her primary complaint is sinus pressure and pain around her eyes.  She is requesting steroids to help alleviate the pressure.  I advised her that in the setting of COVID steroids are contraindicated.  I will order confirmatory COVID PCR.  We discussed antiviral therapy and she states that she does not want antivirals.  COVID PCR is positive.  Discharge patient with a diagnosis of COVID-19.  ER and return precautions reviewed.  Tylenol and/or ibuprofen as needed for fever or pain.  Atrovent nasal spray for the congestion along with Tessalon Perles and (M cough syrup for cough and congestion.   Final Clinical Impressions(s) / UC Diagnoses   Final diagnoses:  COVID-19     Discharge Instructions      CDC guidelines state that you must wear a mask for the first 5 days of symptoms when you are around other people.  After 5 days you no longer need to mask as  you are no longer considered infectious.  There is no longer need to quarantine unless you have a fever.  If you do have a fever then you need to quarantine until you have been fever free for 24 hours without taking Tylenol and/or ibuprofen.  Use over-the-counter Tylenol and/or ibuprofen according to the package instructions as needed for fever and pain.  Use the Atrovent nasal spray, 2 squirts up each nostril every 6 hours, as needed for nasal congestion and runny nose.  Use the Tessalon Perles every 8 hours during the day as needed for cough.  Take them with a small sip of water.  You may experience numbness to the base of your tongue or metallic taste in her mouth, this is normal.  Use the Promethazine DM cough syrup at bedtime as needed for cough and congestion.  Be mindful this medication will make you sleepy.  If you develop any worsening respiratory symptoms such as shortness of breath, shortness of breath at rest, feel as though you cannot catch your breath, you are unable to speak in full sentences, or, as a late sign, your lips begin turning blue you need to call 911 and go to the ER for evaluation.      ED Prescriptions     Medication Sig Dispense Auth. Provider   benzonatate (TESSALON) 100 MG capsule Take 2 capsules (200 mg total) by mouth every 8 (eight) hours. 21 capsule Becky Augusta, NP  ipratropium (ATROVENT) 0.06 % nasal spray Place 2 sprays into both nostrils 4 (four) times daily. 15 mL Becky Augusta, NP   promethazine-dextromethorphan (PROMETHAZINE-DM) 6.25-15 MG/5ML syrup Take 5 mLs by mouth 4 (four) times daily as needed. 118 mL Becky Augusta, NP      PDMP not reviewed this encounter.   Becky Augusta, NP 11/18/23 1115

## 2023-11-20 ENCOUNTER — Other Ambulatory Visit: Payer: Self-pay | Admitting: Family Medicine

## 2023-11-20 DIAGNOSIS — G43009 Migraine without aura, not intractable, without status migrainosus: Secondary | ICD-10-CM

## 2023-11-20 MED ORDER — SUMATRIPTAN SUCCINATE 25 MG PO TABS: 25.0000 mg | ORAL_TABLET | ORAL | 0 refills | Status: DC | PRN

## 2023-11-27 ENCOUNTER — Other Ambulatory Visit (HOSPITAL_COMMUNITY)
Admission: RE | Admit: 2023-11-27 | Discharge: 2023-11-27 | Disposition: A | Discharge status: Home / Self Care | Payer: BC Managed Care – PPO | Source: Ambulatory Visit | Attending: Family Medicine | Admitting: Family Medicine

## 2023-11-27 ENCOUNTER — Ambulatory Visit (INDEPENDENT_AMBULATORY_CARE_PROVIDER_SITE_OTHER): Payer: BC Managed Care – PPO | Admitting: Family Medicine

## 2023-11-27 ENCOUNTER — Encounter: Payer: Self-pay | Admitting: Family Medicine

## 2023-11-27 VITALS — BP 102/70 | HR 74 | Resp 16 | Ht 64.0 in | Wt 205.0 lb

## 2023-11-27 DIAGNOSIS — Z8619 Personal history of other infectious and parasitic diseases: Secondary | ICD-10-CM

## 2023-11-27 DIAGNOSIS — Z124 Encounter for screening for malignant neoplasm of cervix: Secondary | ICD-10-CM

## 2023-11-27 NOTE — Progress Notes (Signed)
 Established patient visit   Patient: Savannah Callahan   DOB: 12/23/1990   32 y.o. Female  MRN: [id number] Visit Date: 11/27/2023  Today's healthcare provider: Rockie Agent, MD   Chief Complaint  Patient presents with   Gynecologic Exam   Subjective       Discussed the use of AI scribe software for clinical note transcription with the patient, who gave verbal consent to proceed.  History of Present Illness   The patient, with a history of irregular menstrual cycles managed with a Mirena  IUD, presents for a routine check-up. She reports occasional spotting, which she has noticed tends to coincide with dietary changes. Specifically, she notes that when she consumes less healthy foods, she tends to spot more frequently. However, she denies any associated pain, itching, or abnormal discharge.  The patient also mentions a previous incident where her healthcare provider was unable to locate the strings of the Mirena  IUD, necessitating a visit to the doctor. Currently, the patient is five years into the use of the Mirena  IUD, which she was advised could be left in place for up to eight years. She expresses a willingness to consider early replacement if the spotting becomes more bothersome.         Past Medical History:  Diagnosis Date   Depression     Medications: Outpatient Medications Prior to Visit  Medication Sig   ipratropium (ATROVENT ) 0.06 % nasal spray Place 2 sprays into both nostrils 4 (four) times daily.   levonorgestrel  (MIRENA ) 20 MCG/24HR IUD 1 each by Intrauterine route once.   phentermine  37.5 MG capsule Take 1 capsule (37.5 mg total) by mouth every morning.   spironolactone (ALDACTONE) 100 MG tablet Take 100 mg by mouth daily.   SUMAtriptan  (IMITREX ) 25 MG tablet Take 1 tablet (25 mg total) by mouth every 2 (two) hours as needed for migraine. May repeat in 2 hours if headache persists or recurs.   tretinoin  (RETIN-A ) 0.05 % cream Apply topically at  bedtime.   [DISCONTINUED] benzonatate  (TESSALON ) 100 MG capsule Take 2 capsules (200 mg total) by mouth every 8 (eight) hours.   [DISCONTINUED] promethazine -dextromethorphan (PROMETHAZINE -DM) 6.25-15 MG/5ML syrup Take 5 mLs by mouth 4 (four) times daily as needed.   No facility-administered medications prior to visit.    Review of Systems      Component Value Date/Time   DIAGPAP  01/11/2022 0951    - Negative for intraepithelial lesion or malignancy (NILM)   DIAGPAP - Non-diagnostic (A) 09/07/2021 1458   DIAGPAP  08/10/2020 1424    - Negative for intraepithelial lesion or malignancy (NILM)   HPVHIGH Other 09/07/2021 1458   HPVHIGH Positive (A) 08/10/2020 1424   ADEQPAP  01/11/2022 0951    Satisfactory for evaluation; transformation zone component PRESENT.   ADEQPAP  09/07/2021 1458    UNSATISFACTORY for evaluation due to obscuring inflammation. The   [facility]  09/07/2021 1458    specimen is processed and examined microscopically, but is found to be   ADEQPAP  09/07/2021 1458    unsatisfactory for evaluation of an epithelial abnormality. Repeat study   ADEQPAP recommended. 09/07/2021 1458    High Risk HPV: Positive  Adequacy:  Satisfactory for evaluation, transformation zone component PRESENT  Diagnosis:  Atypical squamous cells of undetermined significance (ASC-US )     Objective    BP 102/70   Pulse 74   Resp 16   Ht 5' 4 (1.626 m)   Wt 205 lb (93 kg)   SpO2  98%   BMI 35.19 kg/m  BP Readings from Last 3 Encounters:  11/27/23 102/70  11/18/23 116/68  10/11/23 104/80   Wt Readings from Last 3 Encounters:  11/27/23 205 lb (93 kg)  11/18/23 205 lb (93 kg)  10/11/23 203 lb 6.4 oz (92.3 kg)        Physical Exam Exam conducted with a chaperone present.  Genitourinary:    General: Normal vulva.     Pubic Area: No rash.      Labia:        Right: No rash, tenderness, lesion or injury.        Left: No rash, tenderness, lesion or injury.      Cervix: Cervical  bleeding present. No erythema.     Comments: IUD strings visible from external ox       No results found for any visits on 11/27/23.  Assessment & Plan     Problem List Items Addressed This Visit       Other   Screening for cervical cancer - Primary   Relevant Orders   Cytology - PAP   History of HPV infection    Assessment and Plan   HPV Screening HPV screening required due to inadequate testing in the last two visits. Has a Mirena  IUD and experiences occasional spotting, attributed to dietary habits. Results will be available within three days. - Perform HPV co-testing - Inform of results within three days  Mirena  IUD Management Mirena  IUD in place for five years with occasional, non-bothersome spotting. Informed that the IUD can remain for up to eight years but may need removal if spotting becomes heavier. Heavy spotting indicates IUD change after five years. - Monitor spotting - Contact [facility] if spotting becomes heavier for potential IUD removal.         No follow-ups on file.         Rockie Agent, MD  [facility] [phone] (phone) [phone] (fax)  [facility]

## 2023-11-28 ENCOUNTER — Ambulatory Visit
Admission: RE | Admit: 2023-11-28 | Discharge: 2023-11-28 | Disposition: A | Discharge status: Home / Self Care | Payer: BC Managed Care – PPO | Source: Ambulatory Visit | Attending: Family Medicine | Admitting: Family Medicine

## 2023-11-28 ENCOUNTER — Ambulatory Visit
Admission: RE | Admit: 2023-11-28 | Discharge: 2023-11-28 | Disposition: A | Discharge status: Home / Self Care | Payer: BC Managed Care – PPO | Attending: Family Medicine | Admitting: Family Medicine

## 2023-11-28 DIAGNOSIS — M25462 Effusion, left knee: Secondary | ICD-10-CM | POA: Insufficient documentation

## 2023-11-28 DIAGNOSIS — M25562 Pain in left knee: Secondary | ICD-10-CM | POA: Diagnosis present

## 2023-11-29 LAB — CYTOLOGY - PAP
Comment: NEGATIVE
Diagnosis: NEGATIVE
High risk HPV: NEGATIVE

## 2024-02-04 ENCOUNTER — Encounter: Payer: Self-pay | Admitting: Family Medicine

## 2024-02-05 ENCOUNTER — Telehealth: Payer: Self-pay

## 2024-02-05 MED ORDER — VALACYCLOVIR HCL 1 G PO TABS: 1000.0000 mg | ORAL_TABLET | Freq: Two times a day (BID) | ORAL | 0 refills | Status: AC

## 2024-02-05 NOTE — Telephone Encounter (Signed)
 Hi Dr. Sharol Harness,   I use valacyclovir 1gm as needed for cold sores. I have had it prescribed before, but it's not on my list of medications. If you find it appropriate, can you send in a new prescription for me? I was taking 2 bid prn and would like qty 24 as I usually do the 2 bid for 3 days and 24 will give me enough for two treatments.    Thank you,  Savannah Callahan    LOV 1*6*25 NOV none  LRF [id number] LABS [id number]

## 2024-02-05 NOTE — Telephone Encounter (Signed)
 Request has been sent to the provider.

## 2024-05-13 ENCOUNTER — Other Ambulatory Visit: Payer: Self-pay

## 2024-05-13 ENCOUNTER — Encounter: Payer: Self-pay | Admitting: Family Medicine

## 2024-05-13 ENCOUNTER — Telehealth: Payer: Self-pay | Admitting: Family Medicine

## 2024-05-13 DIAGNOSIS — G43009 Migraine without aura, not intractable, without status migrainosus: Secondary | ICD-10-CM

## 2024-05-13 NOTE — Telephone Encounter (Signed)
 Converted into a refill request

## 2024-05-13 NOTE — Telephone Encounter (Signed)
 [facility] pharmacy faxed refill request for the following medications:   SUMAtriptan  (IMITREX ) 25 MG tablet   Please advise

## 2024-05-14 ENCOUNTER — Other Ambulatory Visit: Payer: Self-pay

## 2024-05-14 DIAGNOSIS — G43009 Migraine without aura, not intractable, without status migrainosus: Secondary | ICD-10-CM

## 2024-05-14 NOTE — Telephone Encounter (Signed)
See Rx request ° °

## 2024-05-17 ENCOUNTER — Ambulatory Visit: Admitting: Physician Assistant

## 2024-05-17 ENCOUNTER — Encounter: Payer: Self-pay | Admitting: Physician Assistant

## 2024-05-17 VITALS — BP 99/63 | HR 67 | Resp 16 | Wt 205.0 lb

## 2024-05-17 DIAGNOSIS — G43009 Migraine without aura, not intractable, without status migrainosus: Secondary | ICD-10-CM

## 2024-05-17 MED ORDER — SUMATRIPTAN SUCCINATE 25 MG PO TABS
25.0000 mg | ORAL_TABLET | ORAL | 0 refills | Status: AC | PRN
Start: 2024-05-17 — End: ?

## 2024-05-17 NOTE — Progress Notes (Signed)
 Established patient visit  Patient: Savannah Callahan   DOB: 1990-12-31   33 y.o. Female  MRN: [id number] Visit Date: 05/17/2024  Today's healthcare provider: Jolynn Spencer, PA-C   Chief Complaint  Patient presents with   Medication Refill    Med refill... No other concerns   Subjective     HPI     Medication Refill    Additional comments: Med refill... No other concerns      Last edited by Marylen Odella CROME, CMA on 05/17/2024  8:37 AM.       Discussed the use of AI scribe software for clinical note transcription with the patient, who gave verbal consent to proceed.  History of Present Illness Savannah Callahan is a 33 year old female with migraines who presents for medication management.  She experiences migraines two to three times a month, with light sensitivity, occasional vomiting, and dull headaches lasting a few days. The frequency has increased to one to ten times per month. She uses sumatriptan  as a rescue medication, taking one tablet approximately twice a month. Initially, she manages migraines with 600 mg of ibuprofen  and 1000 mg of Tylenol . If there is no improvement after four hours, she takes sumatriptan .  Migraines often occur on the right side of her head, accompanied by swelling in her right gums. Her dentist has not expressed concern about the gum swelling. She has a Mirena  IUD and does not have a regular menstrual cycle. Her mother had headaches during her childhood.  She has been experiencing migraines since her teenage years. She sees a chiropractor twice a month and receives massages to manage tension and migraines. She reports tension in the right side of her neck, which her massage therapist addresses by stripping the muscles in that area.  No dizziness, double vision, flushing, tingling, or numbness. No issues with thyroid reported.       05/17/2024    8:51 AM 11/27/2023    3:38 PM 10/11/2023    1:40 PM  Depression screen PHQ 2/9  Decreased Interest  0 0 0  Down, Depressed, Hopeless 0 0 0  PHQ - 2 Score 0 0 0  Altered sleeping 0 0 0  Tired, decreased energy 0 0 0  Change in appetite 0 0 0  Feeling bad or failure about yourself  0 0 0  Trouble concentrating 0 0 0  Moving slowly or fidgety/restless 0 0 0  Suicidal thoughts 0 0 0  PHQ-9 Score 0 0 0  Difficult doing work/chores Not difficult at all  Not difficult at all      05/17/2024    8:51 AM 11/27/2023    3:38 PM 10/11/2023    1:40 PM 09/05/2023    1:53 PM  GAD 7 : Generalized Anxiety Score  Nervous, Anxious, on Edge 1 1 1 1   Control/stop worrying 0 0 0 0  Worry too much - different things 1 0 0 0  Trouble relaxing 1 0 0 0  Restless 0 0 0 0  Easily annoyed or irritable 1 0 1 1  Afraid - awful might happen 0 0 0 0  Total GAD 7 Score 4 1 2 2   Anxiety Difficulty Not difficult at all  Not difficult at all Not difficult at all    Medications: Outpatient Medications Prior to Visit  Medication Sig   levonorgestrel  (MIRENA ) 20 MCG/24HR IUD 1 each by Intrauterine route once.   phentermine  37.5 MG capsule Take 1 capsule (37.5 mg total) by mouth every morning.  spironolactone (ALDACTONE) 100 MG tablet Take 100 mg by mouth daily.   tretinoin  (RETIN-A ) 0.05 % cream Apply topically at bedtime.   [DISCONTINUED] SUMAtriptan  (IMITREX ) 25 MG tablet Take 1 tablet (25 mg total) by mouth every 2 (two) hours as needed for migraine. May repeat in 2 hours if headache persists or recurs.   ipratropium (ATROVENT ) 0.06 % nasal spray Place 2 sprays into both nostrils 4 (four) times daily.   No facility-administered medications prior to visit.    Review of Systems  All other systems reviewed and are negative.  All negative Except see HPI       Objective    BP 99/63 (BP Location: Left Arm, Patient Position: Sitting, Cuff Size: Normal)   Pulse 67   Resp 16   Wt 205 lb (93 kg)   SpO2 98%   BMI 35.19 kg/m     Physical Exam Vitals reviewed.  Constitutional:      General: She is  not in acute distress.    Appearance: Normal appearance. She is well-developed. She is not diaphoretic.  HENT:     Head: Normocephalic and atraumatic.   Eyes:     General: No scleral icterus.    Conjunctiva/sclera: Conjunctivae normal.   Neck:     Thyroid: No thyromegaly.   Cardiovascular:     Rate and Rhythm: Normal rate and regular rhythm.     Pulses: Normal pulses.     Heart sounds: Normal heart sounds. No murmur heard. Pulmonary:     Effort: Pulmonary effort is normal. No respiratory distress.     Breath sounds: Normal breath sounds. No wheezing, rhonchi or rales.   Musculoskeletal:     Cervical back: Neck supple.     Right lower leg: No edema.     Left lower leg: No edema.  Lymphadenopathy:     Cervical: No cervical adenopathy.   Skin:    General: Skin is warm and dry.     Findings: No rash.   Neurological:     Mental Status: She is alert and oriented to person, place, and time. Mental status is at baseline.   Psychiatric:        Mood and Affect: Mood normal.        Behavior: Behavior normal.      No results found for any visits on 05/17/24.      Assessment and Plan Assessment & Plan Migraine without aura Experiences migraines 2-3 times monthly with photophobia, occasional emesis, and unilateral pain. Sumatriptan  effective as rescue medication. Prefers sumatriptan  over newer options. - Prescribe sumatriptan  25 mg without refills. - Advise monitoring migraine frequency and sumatriptan  efficacy. - Discuss potential alternative treatments if frequency increases.  Thyroid asymmetry Asymmetry in thyroid region with no symptoms of dysfunction. Previous thyroid levels normal. - Mention thyroid asymmetry during upcoming wellness check for further evaluation.  General Health Maintenance Has Mirena  IUD, amenorrheic, not breastfeeding, no plans for more children. Regular chiropractic and massage therapy for tension and migraines. - Schedule wellness check for this  year. - Continue regular chiropractic and massage therapy sessions.    No orders of the defined types were placed in this encounter.   No follow-ups on file.   The patient was advised to call back or seek an in-person evaluation if the symptoms worsen or if the condition fails to improve as anticipated.  I discussed the assessment and treatment plan with the patient. The patient was provided an opportunity to ask questions and all were answered. The  patient agreed with the plan and demonstrated an understanding of the instructions.  I, Herlinda Heady, PA-C have reviewed all documentation for this visit. The documentation on 05/17/2024  for the exam, diagnosis, procedures, and orders are all accurate and complete.  Jolynn Spencer, [facility], MMS [facility] [phone] (phone) [phone] (fax)  [facility]

## 2024-10-23 ENCOUNTER — Encounter: Admitting: Family Medicine
